# Patient Record
Sex: Female | Born: 1945 | Race: White | Hispanic: No | Marital: Married | Smoking: Never smoker
Health system: Southern US, Community
[De-identification: ages and names within clinical notes are randomized; demographics above are authoritative.]

## PROBLEM LIST (undated history)

## (undated) DIAGNOSIS — M1990 Unspecified osteoarthritis, unspecified site: Secondary | ICD-10-CM

## (undated) DIAGNOSIS — M810 Age-related osteoporosis without current pathological fracture: Secondary | ICD-10-CM

## (undated) DIAGNOSIS — I8390 Asymptomatic varicose veins of unspecified lower extremity: Secondary | ICD-10-CM

## (undated) DIAGNOSIS — H269 Unspecified cataract: Secondary | ICD-10-CM

## (undated) DIAGNOSIS — C801 Malignant (primary) neoplasm, unspecified: Secondary | ICD-10-CM

## (undated) HISTORY — DX: Asymptomatic varicose veins of unspecified lower extremity: I83.90

## (undated) HISTORY — DX: Age-related osteoporosis without current pathological fracture: M81.0

## (undated) HISTORY — DX: Malignant (primary) neoplasm, unspecified: C80.1

## (undated) HISTORY — PX: TUBAL LIGATION: SHX77

## (undated) HISTORY — DX: Unspecified osteoarthritis, unspecified site: M19.90

## (undated) HISTORY — PX: OTHER SURGICAL HISTORY: SHX169

## (undated) HISTORY — DX: Unspecified cataract: H26.9

---

## 2004-03-25 HISTORY — PX: COLONOSCOPY: SHX174

## 2005-02-28 ENCOUNTER — Other Ambulatory Visit: Admission: RE | Admit: 2005-02-28 | Discharge: 2005-02-28 | Payer: Self-pay | Admitting: Gynecology

## 2005-03-23 ENCOUNTER — Encounter: Admission: RE | Admit: 2005-03-23 | Discharge: 2005-03-23 | Payer: Self-pay | Admitting: Gynecology

## 2005-07-25 ENCOUNTER — Ambulatory Visit (HOSPITAL_BASED_OUTPATIENT_CLINIC_OR_DEPARTMENT_OTHER): Admission: RE | Admit: 2005-07-25 | Discharge: 2005-07-25 | Payer: Self-pay | Admitting: Surgery

## 2005-07-25 ENCOUNTER — Encounter (INDEPENDENT_AMBULATORY_CARE_PROVIDER_SITE_OTHER): Payer: Self-pay | Admitting: *Deleted

## 2006-04-05 ENCOUNTER — Encounter: Admission: RE | Admit: 2006-04-05 | Discharge: 2006-04-05 | Payer: Self-pay | Admitting: Gynecology

## 2006-04-12 ENCOUNTER — Other Ambulatory Visit: Admission: RE | Admit: 2006-04-12 | Discharge: 2006-04-12 | Payer: Self-pay | Admitting: Gynecology

## 2007-06-19 ENCOUNTER — Encounter: Admission: RE | Admit: 2007-06-19 | Discharge: 2007-06-19 | Payer: Self-pay | Admitting: Gynecology

## 2007-06-27 ENCOUNTER — Other Ambulatory Visit: Admission: RE | Admit: 2007-06-27 | Discharge: 2007-06-27 | Payer: Self-pay | Admitting: Gynecology

## 2008-07-23 ENCOUNTER — Encounter: Admission: RE | Admit: 2008-07-23 | Discharge: 2008-07-23 | Payer: Self-pay | Admitting: Gynecology

## 2010-06-18 ENCOUNTER — Encounter: Payer: Self-pay | Admitting: Gynecology

## 2013-01-06 DIAGNOSIS — Z1211 Encounter for screening for malignant neoplasm of colon: Secondary | ICD-10-CM | POA: Diagnosis not present

## 2013-01-22 DIAGNOSIS — M810 Age-related osteoporosis without current pathological fracture: Secondary | ICD-10-CM | POA: Diagnosis not present

## 2013-01-22 DIAGNOSIS — I8390 Asymptomatic varicose veins of unspecified lower extremity: Secondary | ICD-10-CM | POA: Diagnosis not present

## 2013-02-26 DIAGNOSIS — M810 Age-related osteoporosis without current pathological fracture: Secondary | ICD-10-CM | POA: Diagnosis not present

## 2013-02-26 DIAGNOSIS — I8390 Asymptomatic varicose veins of unspecified lower extremity: Secondary | ICD-10-CM | POA: Diagnosis not present

## 2013-02-26 DIAGNOSIS — Z79899 Other long term (current) drug therapy: Secondary | ICD-10-CM | POA: Diagnosis not present

## 2013-02-26 DIAGNOSIS — K219 Gastro-esophageal reflux disease without esophagitis: Secondary | ICD-10-CM | POA: Diagnosis not present

## 2013-03-14 DIAGNOSIS — Z23 Encounter for immunization: Secondary | ICD-10-CM | POA: Diagnosis not present

## 2013-03-26 DIAGNOSIS — R55 Syncope and collapse: Secondary | ICD-10-CM | POA: Diagnosis not present

## 2013-03-26 DIAGNOSIS — R002 Palpitations: Secondary | ICD-10-CM | POA: Diagnosis not present

## 2013-03-26 DIAGNOSIS — R42 Dizziness and giddiness: Secondary | ICD-10-CM | POA: Diagnosis not present

## 2013-03-26 DIAGNOSIS — M810 Age-related osteoporosis without current pathological fracture: Secondary | ICD-10-CM | POA: Diagnosis not present

## 2013-03-28 DIAGNOSIS — M810 Age-related osteoporosis without current pathological fracture: Secondary | ICD-10-CM | POA: Diagnosis not present

## 2013-05-01 DIAGNOSIS — Z23 Encounter for immunization: Secondary | ICD-10-CM | POA: Diagnosis not present

## 2013-05-01 DIAGNOSIS — M25579 Pain in unspecified ankle and joints of unspecified foot: Secondary | ICD-10-CM | POA: Diagnosis not present

## 2013-07-18 DIAGNOSIS — L821 Other seborrheic keratosis: Secondary | ICD-10-CM | POA: Diagnosis not present

## 2013-07-18 DIAGNOSIS — L820 Inflamed seborrheic keratosis: Secondary | ICD-10-CM | POA: Diagnosis not present

## 2013-07-18 DIAGNOSIS — D235 Other benign neoplasm of skin of trunk: Secondary | ICD-10-CM | POA: Diagnosis not present

## 2013-07-18 DIAGNOSIS — D1801 Hemangioma of skin and subcutaneous tissue: Secondary | ICD-10-CM | POA: Diagnosis not present

## 2013-07-18 DIAGNOSIS — D2370 Other benign neoplasm of skin of unspecified lower limb, including hip: Secondary | ICD-10-CM | POA: Diagnosis not present

## 2013-10-03 DIAGNOSIS — M810 Age-related osteoporosis without current pathological fracture: Secondary | ICD-10-CM | POA: Diagnosis not present

## 2013-11-19 DIAGNOSIS — Z1231 Encounter for screening mammogram for malignant neoplasm of breast: Secondary | ICD-10-CM | POA: Diagnosis not present

## 2013-11-19 DIAGNOSIS — Z01419 Encounter for gynecological examination (general) (routine) without abnormal findings: Secondary | ICD-10-CM | POA: Diagnosis not present

## 2014-03-05 DIAGNOSIS — J1181 Influenza due to unidentified influenza virus with encephalopathy: Secondary | ICD-10-CM | POA: Diagnosis not present

## 2014-03-31 DIAGNOSIS — I868 Varicose veins of other specified sites: Secondary | ICD-10-CM | POA: Diagnosis not present

## 2014-03-31 DIAGNOSIS — M25551 Pain in right hip: Secondary | ICD-10-CM | POA: Diagnosis not present

## 2014-03-31 DIAGNOSIS — J069 Acute upper respiratory infection, unspecified: Secondary | ICD-10-CM | POA: Diagnosis not present

## 2014-04-10 ENCOUNTER — Encounter: Payer: Self-pay | Admitting: Vascular Surgery

## 2014-04-10 ENCOUNTER — Other Ambulatory Visit: Payer: Self-pay | Admitting: *Deleted

## 2014-04-10 DIAGNOSIS — I83893 Varicose veins of bilateral lower extremities with other complications: Secondary | ICD-10-CM

## 2014-04-15 DIAGNOSIS — M25551 Pain in right hip: Secondary | ICD-10-CM | POA: Diagnosis not present

## 2014-04-15 DIAGNOSIS — M810 Age-related osteoporosis without current pathological fracture: Secondary | ICD-10-CM | POA: Diagnosis not present

## 2014-05-12 DIAGNOSIS — H40013 Open angle with borderline findings, low risk, bilateral: Secondary | ICD-10-CM | POA: Diagnosis not present

## 2014-05-12 DIAGNOSIS — H25812 Combined forms of age-related cataract, left eye: Secondary | ICD-10-CM | POA: Diagnosis not present

## 2014-05-13 ENCOUNTER — Encounter: Payer: Self-pay | Admitting: Vascular Surgery

## 2014-05-14 ENCOUNTER — Ambulatory Visit (INDEPENDENT_AMBULATORY_CARE_PROVIDER_SITE_OTHER): Payer: Medicare Other | Admitting: Vascular Surgery

## 2014-05-14 ENCOUNTER — Ambulatory Visit (HOSPITAL_COMMUNITY)
Admission: RE | Admit: 2014-05-14 | Discharge: 2014-05-14 | Disposition: A | Discharge status: Home / Self Care | Payer: Medicare Other | Source: Ambulatory Visit | Attending: Vascular Surgery | Admitting: Vascular Surgery

## 2014-05-14 ENCOUNTER — Encounter: Payer: Self-pay | Admitting: Vascular Surgery

## 2014-05-14 VITALS — BP 106/71 | HR 80 | Ht 67.5 in | Wt 127.3 lb

## 2014-05-14 DIAGNOSIS — I83893 Varicose veins of bilateral lower extremities with other complications: Secondary | ICD-10-CM | POA: Diagnosis not present

## 2014-05-14 DIAGNOSIS — I8393 Asymptomatic varicose veins of bilateral lower extremities: Secondary | ICD-10-CM | POA: Diagnosis not present

## 2014-05-14 NOTE — Progress Notes (Signed)
[facility] [place] HISTORY AND PHYSICAL   History of Present Illness:  Patient is a 68 y.o. year old female who presents for evaluation of symptomatic varicose veins. The patient has had varicose veins for several years. However the last few years her legs have begun to hurt more. She primarily complains of pain over a cluster of varicosities on the right posterior thigh. She does a lot of walking for exercise. She states that her legs become heavy tired and achy especially over this cluster of veins on the back of her leg after exercising for long periods of time. She has similar symptoms in the left leg but not as bad. She has a cluster of varicosities on the left ankle and the bother her more on the left leg. She has worn some light compression in the past and has had some benefit from this. However the cluster of varicosities on the right posterior thigh is high enough that she does not get much relief from this. She denies prior history of ulcers or bleeding from her varicose veins. She denies prior history of DVT. Other medical problems include osteoporosis which is stable.  Past Medical History  Diagnosis Date  . Osteoporosis   . Varicose veins   . Cancer     squamous cell     Past Surgical History  Procedure Laterality Date  . Colonoscopy  03/25/2004  . Tubal ligation      Social History History  Substance Use Topics  . Smoking status: Never Smoker   . Smokeless tobacco: Never Used  . Alcohol Use: 0.0 oz/week    0 Not specified per week     Comment: occ    Family History Family History  Problem Relation Age of Onset  . Stroke Father   . Cancer Father     cancer  . Varicose Veins Father   . Bleeding Disorder Father   . Cancer Brother     Allergies  No Known Allergies   Current Outpatient Prescriptions  Medication Sig Dispense Refill  . aspirin 81 MG tablet Take 81 mg by mouth daily.    Marland Kitchen denosumab (PROLIA) 60 MG/ML SOLN injection Inject 60  mg into the skin every 6 (six) months. Administer in upper arm, thigh, or abdomen    . Multiple Vitamin (MULTIVITAMIN) tablet Take 1 tablet by mouth daily.     No current facility-administered medications for this visit.    ROS:   General:  No weight loss, Fever, chills  HEENT: No recent headaches, no nasal bleeding, no visual changes, no sore throat  Neurologic: No dizziness, blackouts, seizures. No recent symptoms of stroke or mini- stroke. No recent episodes of slurred speech, or temporary blindness.  Cardiac: No recent episodes of chest pain/pressure, no shortness of breath at rest.  No shortness of breath with exertion.  Denies history of atrial fibrillation or irregular heartbeat  Vascular: No history of rest pain in feet.  No history of claudication.  No history of non-healing ulcer, No history of DVT   Pulmonary: No home oxygen, no productive cough, no hemoptysis,  No asthma or wheezing  Musculoskeletal:  [ ]  Arthritis, [ ]  Low back pain,  [ ]  Joint pain  Hematologic:No history of hypercoagulable state.  No history of easy bleeding.  No history of anemia  Gastrointestinal: No hematochezia or melena,  No gastroesophageal reflux, no trouble swallowing  Urinary: [ ]  chronic Kidney disease, [ ]  on HD - [ ]  MWF or [ ]   TTHS, [ ]  Burning with urination, [ ]  Frequent urination, [ ]  Difficulty urinating;   Skin: No rashes  Psychological: No history of anxiety,  No history of depression   Physical Examination  Filed Vitals:   05/14/14 1003  BP: 106/71  Pulse: 80  Height: 5' 7.5" (1.715 m)  Weight: 127 lb 4.8 oz (57.743 kg)  SpO2: 100%    Body mass index is 19.63 kg/(m^2).  General:  Alert and oriented, no acute distress HEENT: Normal Neck: No bruit or JVD Pulmonary: Clear to auscultation bilaterally Cardiac: Regular Rate and Rhythm without murmur Abdomen: Soft, non-tender, non-distended, no mass Skin: No rash, scattered spider-type varicosities dorsal/anterior thigh  bilaterally with few scattered in the calf as well, large clusters 4-6 mm in diameter over an area of 5-7 cm right upper posterior thigh, right lateral knee, left posterior calf, medial and lateral left knee, left lateral ankle, right posterior calf Extremity Pulses:  2+ radial, brachial, femoral, dorsalis pedis pulses bilaterally Musculoskeletal: No deformity or edema  Neurologic: Upper and lower extremity motor 5/5 and symmetric  DATA:  Patient had bilateral venous duplex exam today. This showed evidence of reflux in the deep system in the femoral and popliteal vein. She also had reflux in the greater saphenous vein bilaterally. Vein diameter was 4-6 mm.   ASSESSMENT: Bilateral symptomatic varicose veins. Right greater than left.   PLAN:  The patient was given a prescription today for bilateral thigh-high compression stockings. She will wear these continuously during the day to see if she gets symptomatic relief. If she has not improved significantly she will consider laser ablation and possible several areas of sclerotherapy or stab avulsion in 3 months.  Ruta Hinds, MD [facility] [place] Office: [phone] Pager: [phone]

## 2014-06-04 DIAGNOSIS — H52202 Unspecified astigmatism, left eye: Secondary | ICD-10-CM | POA: Diagnosis not present

## 2014-06-04 DIAGNOSIS — H2512 Age-related nuclear cataract, left eye: Secondary | ICD-10-CM | POA: Diagnosis not present

## 2014-06-04 DIAGNOSIS — H25812 Combined forms of age-related cataract, left eye: Secondary | ICD-10-CM | POA: Diagnosis not present

## 2014-06-04 HISTORY — PX: OTHER SURGICAL HISTORY: SHX169

## 2014-06-15 DIAGNOSIS — H25811 Combined forms of age-related cataract, right eye: Secondary | ICD-10-CM | POA: Diagnosis not present

## 2014-06-15 DIAGNOSIS — H52201 Unspecified astigmatism, right eye: Secondary | ICD-10-CM | POA: Diagnosis not present

## 2014-06-15 DIAGNOSIS — H2511 Age-related nuclear cataract, right eye: Secondary | ICD-10-CM | POA: Diagnosis not present

## 2014-06-15 HISTORY — PX: OTHER SURGICAL HISTORY: SHX169

## 2014-06-30 DIAGNOSIS — H35371 Puckering of macula, right eye: Secondary | ICD-10-CM | POA: Diagnosis not present

## 2014-07-23 DIAGNOSIS — D225 Melanocytic nevi of trunk: Secondary | ICD-10-CM | POA: Diagnosis not present

## 2014-07-23 DIAGNOSIS — D2239 Melanocytic nevi of other parts of face: Secondary | ICD-10-CM | POA: Diagnosis not present

## 2014-07-23 DIAGNOSIS — D1801 Hemangioma of skin and subcutaneous tissue: Secondary | ICD-10-CM | POA: Diagnosis not present

## 2014-07-23 DIAGNOSIS — L821 Other seborrheic keratosis: Secondary | ICD-10-CM | POA: Diagnosis not present

## 2014-08-17 ENCOUNTER — Encounter: Payer: Self-pay | Admitting: Vascular Surgery

## 2014-08-18 ENCOUNTER — Ambulatory Visit (INDEPENDENT_AMBULATORY_CARE_PROVIDER_SITE_OTHER): Payer: Medicare Other | Admitting: Vascular Surgery

## 2014-08-18 ENCOUNTER — Encounter: Payer: Self-pay | Admitting: Vascular Surgery

## 2014-08-18 VITALS — BP 107/73 | HR 77 | Resp 18 | Ht 67.5 in | Wt 128.0 lb

## 2014-08-18 DIAGNOSIS — I83893 Varicose veins of bilateral lower extremities with other complications: Secondary | ICD-10-CM

## 2014-08-18 DIAGNOSIS — I83899 Varicose veins of unspecified lower extremities with other complications: Secondary | ICD-10-CM | POA: Insufficient documentation

## 2014-08-18 NOTE — Progress Notes (Signed)
Problems with Activities of Daily Living Secondary to Leg Pain  1. Mrs. Angela Juarez states any activities that require prolonged standing (cooking, cleaning, shopping) is difficult due to leg pain.    2. Mrs. Angela Juarez states that walking (for exercise) is difficult due to leg pain.    3. Mrs. Angela Juarez states that prolonged sitting while traveling in car is difficult due to leg pain.     Failure of  Conservative Therapy:  1. Worn 20-30 mm Hg thigh high compression hose >3 months with no relief of symptoms.  2. Frequently elevates legs-no relief of symptoms  3. Taken Ibuprofen 600 Mg TID with no relief of symptoms.  Patient continues to have bilateral lower extremity discomfort with prolonged standing despite very compliant use of her thigh-high graduated compression garments. The pain is over the anterior and lateral right thigh and medial left thigh and calf.  She does have 2+ dorsalis pedis pulses bilaterally. She does have plexus of varicosities over her right anterior thigh extending to her lateral knee and down her lateral calf. I imaged these areas with SonoSite ultrasound this does track up to the saphenous vein below the saphenofemoral junction. On the left she has varicosities throughout her medial calf and these do arise from her left great saphenous vein  Impression and plan failed conservative treatment of venous hypertension. Have recommended staged bilateral laser ablation of great saphenous vein. Explained that she may require subsequent stab phlebectomy of tributary varicosities should she not have complete relief with the ablation. I explained that Medicare requires that these are done in a staged fashion. She wished to proceed as soon as possible.

## 2014-08-20 ENCOUNTER — Other Ambulatory Visit: Payer: Self-pay | Admitting: *Deleted

## 2014-08-20 DIAGNOSIS — I83893 Varicose veins of bilateral lower extremities with other complications: Secondary | ICD-10-CM

## 2014-08-24 ENCOUNTER — Telehealth: Payer: Self-pay | Admitting: Vascular Surgery

## 2014-08-24 NOTE — Telephone Encounter (Signed)
-----   Message from Rica Records, RN sent at 08/20/2014  5:20 PM EDT ----- Regarding: scheduling Please schedule Frances Maywood for:  1. 09-17-2014   Post LA duplex (right leg, order in EPIC) and VV FU with Dr. Donnetta Hutching.  2.  10-06-2014 Post LA duplex (left leg, order in EPIC) and VV FU with Dr. Donnetta Hutching.    Thanks!

## 2014-09-07 DIAGNOSIS — E785 Hyperlipidemia, unspecified: Secondary | ICD-10-CM | POA: Diagnosis not present

## 2014-09-07 DIAGNOSIS — R002 Palpitations: Secondary | ICD-10-CM | POA: Diagnosis not present

## 2014-09-09 ENCOUNTER — Encounter: Payer: Self-pay | Admitting: Vascular Surgery

## 2014-09-10 ENCOUNTER — Encounter: Payer: Self-pay | Admitting: Vascular Surgery

## 2014-09-10 ENCOUNTER — Ambulatory Visit (INDEPENDENT_AMBULATORY_CARE_PROVIDER_SITE_OTHER): Payer: Medicare Other | Admitting: Vascular Surgery

## 2014-09-10 VITALS — BP 109/64 | HR 70 | Resp 16 | Ht 67.5 in | Wt 128.0 lb

## 2014-09-10 DIAGNOSIS — I83893 Varicose veins of bilateral lower extremities with other complications: Secondary | ICD-10-CM | POA: Diagnosis not present

## 2014-09-10 HISTORY — PX: ENDOVENOUS ABLATION SAPHENOUS VEIN W/ LASER: SUR449

## 2014-09-10 NOTE — Progress Notes (Signed)
     Laser Ablation Procedure    Date: 09/10/2014   Angela Juarez DOB:Oct 21, 1945  Consent signed: Yes    Surgeon:  Dr. Sherren Mocha Ashdon Gillson  Procedure: Laser Ablation: right Greater Saphenous Vein  BP 109/64 mmHg  Pulse 70  Resp 16  Ht 5' 7.5" (1.715 m)  Wt 128 lb (58.06 kg)  BMI 19.74 kg/m2  Tumescent Anesthesia: 400 cc 0.9% NaCl with 50 cc Lidocaine HCL with 1% Epi and 15 cc 8.4% NaHCO3  Local Anesthesia: 62172 cc Lidocaine HCL and NaHCO3 (ratio 2:1)  15 watts continuous mode        Total energy: 2172   Total time: 2:25    Patient tolerated procedure well  Notes:   Description of Procedure:  After marking the course of the secondary varicosities, the patient was placed on the operating table in the supine position, and the right leg was prepped and draped in sterile fashion.   Local anesthetic was administered and under ultrasound guidance the saphenous vein was accessed with a micro needle and guide wire; then the mirco puncture sheath was placed.  A guide wire was inserted saphenofemoral junction , followed by a 5 french sheath.  The position of the sheath and then the laser fiber below the junction was confirmed using the ultrasound.  Tumescent anesthesia was administered along the course of the saphenous vein using ultrasound guidance. The patient was placed in Trendelenburg position and protective laser glasses were placed on patient and staff, and the laser was fired at 15 watts continuous mode advancing 1-77mm/second for a total of 2172 joules.    Steri strips were applied to the stab wounds and ABD pads and thigh high compression stockings were applied.  Ace wrap bandages were applied over the phlebectomy sites and at the top of the saphenofemoral junction. Blood loss was less than 15 cc.  The patient ambulated out of the operating room having tolerated the procedure well.

## 2014-09-16 ENCOUNTER — Encounter: Payer: Self-pay | Admitting: Vascular Surgery

## 2014-09-17 ENCOUNTER — Encounter: Payer: Self-pay | Admitting: Vascular Surgery

## 2014-09-17 ENCOUNTER — Ambulatory Visit (HOSPITAL_COMMUNITY)
Admission: RE | Admit: 2014-09-17 | Discharge: 2014-09-17 | Disposition: A | Discharge status: Home / Self Care | Payer: Medicare Other | Source: Ambulatory Visit | Attending: Vascular Surgery | Admitting: Vascular Surgery

## 2014-09-17 ENCOUNTER — Ambulatory Visit (INDEPENDENT_AMBULATORY_CARE_PROVIDER_SITE_OTHER): Payer: Medicare Other | Admitting: Vascular Surgery

## 2014-09-17 VITALS — BP 106/67 | HR 67 | Resp 16 | Ht 67.5 in | Wt 127.0 lb

## 2014-09-17 DIAGNOSIS — I83893 Varicose veins of bilateral lower extremities with other complications: Secondary | ICD-10-CM

## 2014-09-17 NOTE — Progress Notes (Signed)
Patient presents today for one-week follow-up of ablation of her right great saphenous vein. She had minimal bruising and minimal discomfort associated with this. She has been compliant with her compression garments. On physical exam she does have very minimal bruising in her medial thigh. Continues to have distention of tributary varicosities over her lateral calf and anterior thigh.  On duplex today she does have closure of her great saphenous vein from the entry site at her knee I just to the 1.4 cm below the saphenofemoral junction with no evidence of DVT  Impression and plan excellent Angela Juarez result from ablation of great saphenous vein on the right. She is scheduled for similar treatment to her left great saphenous vein in one week. 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

## 2014-09-23 ENCOUNTER — Encounter: Payer: Self-pay | Admitting: Vascular Surgery

## 2014-09-24 ENCOUNTER — Ambulatory Visit (INDEPENDENT_AMBULATORY_CARE_PROVIDER_SITE_OTHER): Payer: Medicare Other | Admitting: Vascular Surgery

## 2014-09-24 ENCOUNTER — Encounter: Payer: Self-pay | Admitting: Vascular Surgery

## 2014-09-24 VITALS — BP 110/72 | HR 69 | Resp 18 | Ht 67.5 in | Wt 128.0 lb

## 2014-09-24 DIAGNOSIS — I83893 Varicose veins of bilateral lower extremities with other complications: Secondary | ICD-10-CM

## 2014-09-24 HISTORY — PX: ENDOVENOUS ABLATION SAPHENOUS VEIN W/ LASER: SUR449

## 2014-09-24 NOTE — Progress Notes (Signed)
     Laser Ablation Procedure    Date: 09/24/2014   Angela Juarez DOB:1945-07-22  Consent signed: Yes    Surgeon:  Dr. Sherren Mocha Early  Procedure: Laser Ablation: left Greater Saphenous Vein  BP 110/72 mmHg  Pulse 69  Resp 18  Ht 5' 7.5" (1.715 m)  Wt 128 lb (58.06 kg)  BMI 19.74 kg/m2  Tumescent Anesthesia: 400 cc 0.9% NaCl with 50 cc Lidocaine HCL with 1% Epi and 15 cc 8.4% NaHCO3  Local Anesthesia: 3 cc Lidocaine HCL and NaHCO3 (ratio 2:1)  15 watts continuous mode        Total energy: 2006 Joules   Total time: 2:13      Patient tolerated procedure well    Description of Procedure:  After marking the course of the secondary varicosities, the patient was placed on the operating table in the supine position, and the left leg was prepped and draped in sterile fashion.   Local anesthetic was administered and under ultrasound guidance the saphenous vein was accessed with a micro needle and guide wire; then the mirco puncture sheath was placed.  A guide wire was inserted saphenofemoral junction , followed by a 5 french sheath.  The position of the sheath and then the laser fiber below the junction was confirmed using the ultrasound.  Tumescent anesthesia was administered along the course of the saphenous vein using ultrasound guidance. The patient was placed in Trendelenburg position and protective laser glasses were placed on patient and staff, and the laser was fired at 15 watts continuous mode advancing 1-64mm/second for a total of 2006 joules.   AndSteri strips were applied and ABD pads and thigh high compression stockings were applied.  Ace wrap bandages were applied over the phlebectomy sites and at the top of the saphenofemoral junction. Blood loss was less than 15 cc.  The patient ambulated out of the operating room having tolerated the procedure well.

## 2014-09-25 ENCOUNTER — Telehealth: Payer: Self-pay | Admitting: *Deleted

## 2014-09-25 NOTE — Telephone Encounter (Signed)
    09/25/2014  Time: 11:40 AM   Patient Name: Angela Juarez  Patient of: T.F. Early  Procedure:Laser Ablation left greater saphenous vein 09-24-2014   Reached patient at home and checked  Her status  Yes    Comments/Actions Taken: Mrs. Obryan states she is not experiencing left leg pain or swelling.  Reviewed all post laser ablation nstructions with her and reminded her of post LA duplex and VV FU appointment with Dr. Donnetta Hutching on 10-06-2014.      @SIGNATURE @

## 2014-10-02 ENCOUNTER — Encounter: Payer: Self-pay | Admitting: Vascular Surgery

## 2014-10-02 DIAGNOSIS — M810 Age-related osteoporosis without current pathological fracture: Secondary | ICD-10-CM | POA: Diagnosis not present

## 2014-10-06 ENCOUNTER — Encounter: Payer: Self-pay | Admitting: Vascular Surgery

## 2014-10-06 ENCOUNTER — Ambulatory Visit (HOSPITAL_COMMUNITY)
Admission: RE | Admit: 2014-10-06 | Discharge: 2014-10-06 | Disposition: A | Discharge status: Home / Self Care | Payer: Medicare Other | Source: Ambulatory Visit | Attending: Vascular Surgery | Admitting: Vascular Surgery

## 2014-10-06 ENCOUNTER — Ambulatory Visit (INDEPENDENT_AMBULATORY_CARE_PROVIDER_SITE_OTHER): Payer: Medicare Other | Admitting: Vascular Surgery

## 2014-10-06 VITALS — BP 113/64 | HR 76 | Resp 14 | Ht 67.5 in | Wt 127.0 lb

## 2014-10-06 DIAGNOSIS — I83893 Varicose veins of bilateral lower extremities with other complications: Secondary | ICD-10-CM | POA: Insufficient documentation

## 2014-10-06 NOTE — Progress Notes (Signed)
Patient presents today for follow-up of left leg laser ablation 10 days ago. She reports typical amount of inflammation around the time of the procedure. This has resolved. She has minimal bruising  Past Medical History  Diagnosis Date  . Osteoporosis   . Varicose veins   . Cancer     squamous cell     History  Substance Use Topics  . Smoking status: Never Smoker   . Smokeless tobacco: Never Used  . Alcohol Use: 0.0 oz/week    0 Standard drinks or equivalent per week     Comment: occ    Family History  Problem Relation Age of Onset  . Stroke Father   . Cancer Father     cancer  . Varicose Veins Father   . Bleeding Disorder Father   . Cancer Brother     No Known Allergies   Current outpatient prescriptions:  .  aspirin 81 MG tablet, Take 81 mg by mouth daily., Disp: , Rfl:  .  denosumab (PROLIA) 60 MG/ML SOLN injection, Inject 60 mg into the skin every 6 (six) months. Administer in upper arm, thigh, or abdomen, Disp: , Rfl:  .  Multiple Vitamin (MULTIVITAMIN) tablet, Take 1 tablet by mouth daily., Disp: , Rfl:   Filed Vitals:   10/06/14 0901  BP: 113/64  Pulse: 76  Resp: 14  Height: 5' 7.5" (1.715 m)  Weight: 127 lb (57.607 kg)    Body mass index is 19.59 kg/(m^2).       She has had minimal bruising over the medial left thigh. Does have some decompression in the varicosities on her pretibial area and left lateral ankle. Continues to have painful varicosities over the right lateral thigh.  She did undergo repeat duplex today of her left leg. This showed successful ablation of her saphenous vein from the distal insertion site at her knee to 0.6 cm from the saphenofemoral junction. No evidence of DVT  Impression and plan successful bilateral staged ablation of great saphenous vein. We'll continue her compression garments on an as-needed basis. Will see [place] again in 3 months to determine if stab phlebectomy is required in her right and/or left leg.

## 2014-11-23 ENCOUNTER — Other Ambulatory Visit: Payer: Self-pay

## 2014-11-24 DIAGNOSIS — Z1231 Encounter for screening mammogram for malignant neoplasm of breast: Secondary | ICD-10-CM | POA: Diagnosis not present

## 2014-12-04 ENCOUNTER — Encounter: Payer: Self-pay | Admitting: Vascular Surgery

## 2014-12-08 ENCOUNTER — Ambulatory Visit (INDEPENDENT_AMBULATORY_CARE_PROVIDER_SITE_OTHER): Payer: Medicare Other | Admitting: Vascular Surgery

## 2014-12-08 ENCOUNTER — Encounter: Payer: Self-pay | Admitting: Vascular Surgery

## 2014-12-08 VITALS — BP 123/74 | HR 65 | Temp 98.4°F | Resp 18 | Ht 67.5 in | Wt 126.8 lb

## 2014-12-08 DIAGNOSIS — I83893 Varicose veins of bilateral lower extremities with other complications: Secondary | ICD-10-CM

## 2014-12-08 NOTE — Progress Notes (Signed)
Patient resents today for continued discussion of venous hypertension in both lower extremities. She is status post staged bilateral great saphenous vein laser ablation 3 months ago. She continues to have large varicosities bilaterally. On the right leg this is throughout her anterior thigh going to her lateral thigh lateral knee and extending into her calf. On the left leg this is on her distal thigh lateral calf and extensively down onto her ankle. She continues to have discomfort despite compression garment usage of these.  Past Medical History  Diagnosis Date  . Osteoporosis   . Varicose veins   . Cancer     squamous cell     History  Substance Use Topics  . Smoking status: Never Smoker   . Smokeless tobacco: Never Used  . Alcohol Use: 0.0 oz/week    0 Standard drinks or equivalent per week     Comment: occ    Family History  Problem Relation Age of Onset  . Stroke Father   . Cancer Father     cancer  . Varicose Veins Father   . Bleeding Disorder Father   . Cancer Brother     No Known Allergies   Current outpatient prescriptions:  .  denosumab (PROLIA) 60 MG/ML SOLN injection, Inject 60 mg into the skin every 6 (six) months. Administer in upper arm, thigh, or abdomen, Disp: , Rfl:  .  Multiple Vitamin (MULTIVITAMIN) tablet, Take 1 tablet by mouth daily., Disp: , Rfl:  .  aspirin 81 MG tablet, Take 81 mg by mouth daily., Disp: , Rfl:   Filed Vitals:   12/08/14 1003  BP: 123/74  Pulse: 65  Temp: 98.4 F (36.9 C)  TempSrc: Oral  Resp: 18  Height: 5' 7.5" (1.715 m)  Weight: 126 lb 12.8 oz (57.516 kg)  SpO2: 98%    Body mass index is 19.56 kg/(m^2).       I did reimage her veins with SonoSite ultrasound this shows closure of her saphenous vein bilaterally  Impression and plan continued pain with tributary varicosities despite ablation of her great saphenous vein. Have recommended staged bilateral stab phlebectomy of multiple tributary tributary varicosities.  She understands this is under local anesthesia. Wishes to proceed the as soon as possible

## 2014-12-10 ENCOUNTER — Ambulatory Visit (INDEPENDENT_AMBULATORY_CARE_PROVIDER_SITE_OTHER): Payer: Medicare Other | Admitting: Vascular Surgery

## 2014-12-10 ENCOUNTER — Encounter: Payer: Self-pay | Admitting: Vascular Surgery

## 2014-12-10 VITALS — BP 104/69 | HR 84 | Resp 18 | Ht 67.5 in | Wt 126.8 lb

## 2014-12-10 DIAGNOSIS — I83893 Varicose veins of bilateral lower extremities with other complications: Secondary | ICD-10-CM | POA: Diagnosis not present

## 2014-12-10 HISTORY — PX: OTHER SURGICAL HISTORY: SHX169

## 2014-12-10 NOTE — Progress Notes (Signed)
    Stab Phlebectomy Procedure  Angela Juarez DOB:Apr 03, 1946  12/10/2014  Consent signed: Yes  Surgeon:T.F. Shakeera Rightmyer  Procedure: stab phlebectomy: right leg  > 20 incisions  BP 104/69 mmHg  Pulse 84  Resp 18  Ht 5' 7.5" (1.715 m)  Wt 126 lb 12.8 oz (57.516 kg)  BMI 19.56 kg/m2  Start time: 8:40AM   End time: 10:00AM   Tumescent Anesthesia: 450 cc 0.9% NaCl with 50 cc Lidocaine HCL with 1% Epi and 15 cc 8.4% NaHCO3  Local Anesthesia: 2 cc Lidocaine HCL and NaHCO3 (ratio 2:1)  Sclerotherapy: 0.3 %Sotradecol. Patient received a total of 3 cc  Stab Phlebectomy: >20 incisions Sites: Thigh and Calf right leg  Patient tolerated procedure well: Yes    Description of Procedure:  After marking the course of the secondary varicosities, the patient was placed on the operating table in the supine position, and the right leg was prepped and draped in sterile fashion.    The patient was then put into Trendelenburg position.  Local anesthetic was administered at the previously marked varicosities, and tumescent anesthesia was administered around the vessels.  Greater than 20 stab wounds were made using the tip of an 11 blade. And using the vein hook, the phlebectomies were performed using a hemostat to avulse the varicosities.  Adequate hemostasis was achieved, and steri strips were applied to the stab wound.    Sclerotherapy was performed to 3 varicosities using 3  cc .3% Sotradecol foam via a 30 gauge needle needle.  ABD pads and thigh high compression stockings were applied as well ace wraps where needed. Blood loss was less than 15 cc.  The patient ambulated out of the operating room having tolerated the procedure well.

## 2014-12-11 ENCOUNTER — Telehealth: Payer: Self-pay | Admitting: *Deleted

## 2014-12-11 NOTE — Telephone Encounter (Signed)
    12/11/2014  Time: 9:44 AM   Patient Name: Angela Juarez  Patient of: T.F. Early  Procedure:STAB PHLEBECTOMY AND SCLEROTHERAPY  right  LEG  12-10-2014   Reached patient at home and checked  Her status  Yes    Comments/Actions Taken: [name] states that she not experiencing any right leg bleeding or oozing.  No right leg swelling.  States minimal discomfort in right leg relieved by Advil.  Reviewed post procedural instructions with her and reminded her of VV follow up with Dr. Donnetta Hutching on 12-31-2014.      @SIGNATURE @

## 2014-12-15 ENCOUNTER — Encounter: Payer: Self-pay | Admitting: Vascular Surgery

## 2014-12-29 ENCOUNTER — Encounter: Payer: Self-pay | Admitting: Vascular Surgery

## 2014-12-31 ENCOUNTER — Encounter: Payer: Self-pay | Admitting: Vascular Surgery

## 2014-12-31 ENCOUNTER — Ambulatory Visit (INDEPENDENT_AMBULATORY_CARE_PROVIDER_SITE_OTHER): Payer: Medicare Other | Admitting: Vascular Surgery

## 2014-12-31 VITALS — BP 113/74 | HR 67 | Temp 96.9°F | Resp 18 | Ht 67.5 in | Wt 127.0 lb

## 2014-12-31 DIAGNOSIS — I83893 Varicose veins of bilateral lower extremities with other complications: Secondary | ICD-10-CM

## 2014-12-31 NOTE — Progress Notes (Signed)
    Stab Phlebectomy Procedure  Angela Juarez DOB:August 15, 1945  12/31/2014  Consent signed: Yes  Surgeon:T.F. Bayan Hedstrom  Procedure: stab phlebectomy: left leg  BP 113/74 mmHg  Pulse 67  Temp(Src) 96.9 F (36.1 C) (Oral)  Resp 18  Ht 5' 7.5" (1.715 m)  Wt 127 lb (57.607 kg)  BMI 19.59 kg/m2  SpO2 100%  Start time: 8:30AM   End time: 9:45AM   Tumescent Anesthesia: 475 cc 0.9% NaCl with 50 cc Lidocaine HCL with 1% Epi and 15 cc 8.4% NaHCO3  Local Anesthesia: 4 cc Lidocaine HCL and NaHCO3 (ratio 2:1)    Stab Phlebectomy: >20 incisions Sites: Thigh, Calf and Ankle  Left leg  Patient tolerated procedure well: Yes    Description of Procedure:  After marking the course of the secondary varicosities, the patient was placed on the operating table in the supine position, and the left leg was prepped and draped in sterile fashion.    The patient was then put into Trendelenburg position.  Local anesthetic was administered at the previously marked varicosities, and tumescent anesthesia was administered around the vessels.  Greater than 20 stab wounds were made using the tip of an 11 blade. And using the vein hook, the phlebectomies were performed using a hemostat to avulse the varicosities.  Adequate hemostasis was achieved, and steri strips were applied to the stab wound.      ABD pads and thigh high compression stockings were applied as well ace wraps where needed. Blood loss was less than 15 cc.  The patient ambulated out of the operating room having tolerated the procedure well.

## 2015-01-01 ENCOUNTER — Telehealth: Payer: Self-pay | Admitting: *Deleted

## 2015-01-01 ENCOUNTER — Encounter: Payer: Self-pay | Admitting: Vascular Surgery

## 2015-01-01 NOTE — Telephone Encounter (Signed)
    01/01/2015  Time: 9:36 AM   Patient Name: Angela Juarez  Patient of: T.F. Early  Procedure:stab phlebectomy >20 incisions left leg  12-31-2014  Reached patient at home and checked  Her status  Yes    Comments/Actions Taken: [name] states that she has discomfort around left ankle, but this discomfort is relieved by taking Advil.  No complaints of bleeding or oozing.  Advised her she could remove the Ace wrap and remove the ABD pads lying on top of the compression hose and then re-wrap her left leg with the Ace bandage.  [name] verbalized understanding.  Reviewed post procedural instructions with [name] and reminded her of follow up appointment with Dr. Donnetta Hutching on 01-21-2015.      @SIGNATURE @

## 2015-01-05 DIAGNOSIS — Z23 Encounter for immunization: Secondary | ICD-10-CM | POA: Diagnosis not present

## 2015-01-20 ENCOUNTER — Encounter: Payer: Self-pay | Admitting: Vascular Surgery

## 2015-01-21 ENCOUNTER — Encounter: Payer: Self-pay | Admitting: Vascular Surgery

## 2015-01-21 ENCOUNTER — Ambulatory Visit (INDEPENDENT_AMBULATORY_CARE_PROVIDER_SITE_OTHER): Payer: Self-pay | Admitting: Vascular Surgery

## 2015-01-21 VITALS — BP 98/65 | HR 52 | Resp 14 | Ht 67.5 in | Wt 126.0 lb

## 2015-01-21 DIAGNOSIS — I83893 Varicose veins of bilateral lower extremities with other complications: Secondary | ICD-10-CM

## 2015-01-21 NOTE — Progress Notes (Signed)
Here today for a follow-up of staged bilateral saphenous vein ablation and staged bilateral stab phlebectomy for symptomatic varicosities. She has done quite well is been compliant with her compression garments. She does report that she had one episode of swelling focal area on her right lateral knee the phlebectomy sites and then resolved and she also reports that after she removed her stockings from her left leg after 2 weeks of daytime use that she had one episode where she was quite active taking care of a 63-year-old grandchild. She reported the swelling specifically at her anterior ankle on the left. No bruising associated with this.  Physical exam her stab sites all look quite good and she has had a neck result from removal of her varicosities. No evidence of subcutaneous hematoma is present at the site.  Overall think she is doing quite well. She has had a very nice result. I did explain is unusual that she would have this type of swelling 2 weeks out but suspect this was related to some bleeding under the skin. This is completely resolved. She will continue her usual activity and were compression on as-needed basis

## 2015-02-02 DIAGNOSIS — Z23 Encounter for immunization: Secondary | ICD-10-CM | POA: Diagnosis not present

## 2015-02-26 DIAGNOSIS — J209 Acute bronchitis, unspecified: Secondary | ICD-10-CM | POA: Diagnosis not present

## 2015-04-21 DIAGNOSIS — Z9842 Cataract extraction status, left eye: Secondary | ICD-10-CM | POA: Diagnosis not present

## 2015-04-21 DIAGNOSIS — M810 Age-related osteoporosis without current pathological fracture: Secondary | ICD-10-CM | POA: Diagnosis not present

## 2015-04-21 DIAGNOSIS — K219 Gastro-esophageal reflux disease without esophagitis: Secondary | ICD-10-CM | POA: Diagnosis not present

## 2015-04-21 DIAGNOSIS — M7150 Other bursitis, not elsewhere classified, unspecified site: Secondary | ICD-10-CM | POA: Diagnosis not present

## 2015-04-21 DIAGNOSIS — Z7982 Long term (current) use of aspirin: Secondary | ICD-10-CM | POA: Diagnosis not present

## 2015-04-21 DIAGNOSIS — Z9841 Cataract extraction status, right eye: Secondary | ICD-10-CM | POA: Diagnosis not present

## 2015-06-18 DIAGNOSIS — H26491 Other secondary cataract, right eye: Secondary | ICD-10-CM | POA: Diagnosis not present

## 2015-07-26 DIAGNOSIS — L820 Inflamed seborrheic keratosis: Secondary | ICD-10-CM | POA: Diagnosis not present

## 2015-07-26 DIAGNOSIS — D485 Neoplasm of uncertain behavior of skin: Secondary | ICD-10-CM | POA: Diagnosis not present

## 2015-11-25 DIAGNOSIS — Z124 Encounter for screening for malignant neoplasm of cervix: Secondary | ICD-10-CM | POA: Diagnosis not present

## 2015-11-25 DIAGNOSIS — Z1231 Encounter for screening mammogram for malignant neoplasm of breast: Secondary | ICD-10-CM | POA: Diagnosis not present

## 2015-11-25 DIAGNOSIS — Z681 Body mass index (BMI) 19 or less, adult: Secondary | ICD-10-CM | POA: Diagnosis not present

## 2015-12-03 ENCOUNTER — Other Ambulatory Visit: Payer: Self-pay | Admitting: Obstetrics and Gynecology

## 2015-12-03 DIAGNOSIS — R928 Other abnormal and inconclusive findings on diagnostic imaging of breast: Secondary | ICD-10-CM

## 2015-12-10 ENCOUNTER — Ambulatory Visit
Admission: RE | Admit: 2015-12-10 | Discharge: 2015-12-10 | Disposition: A | Discharge status: Home / Self Care | Payer: Medicare Other | Source: Ambulatory Visit | Attending: Obstetrics and Gynecology | Admitting: Obstetrics and Gynecology

## 2015-12-10 DIAGNOSIS — R928 Other abnormal and inconclusive findings on diagnostic imaging of breast: Secondary | ICD-10-CM

## 2015-12-10 DIAGNOSIS — R922 Inconclusive mammogram: Secondary | ICD-10-CM | POA: Diagnosis not present

## 2016-01-25 ENCOUNTER — Other Ambulatory Visit: Payer: Self-pay

## 2016-03-01 DIAGNOSIS — Z23 Encounter for immunization: Secondary | ICD-10-CM | POA: Diagnosis not present

## 2016-04-19 DIAGNOSIS — Z7982 Long term (current) use of aspirin: Secondary | ICD-10-CM | POA: Diagnosis not present

## 2016-04-19 DIAGNOSIS — M8000XA Age-related osteoporosis with current pathological fracture, unspecified site, initial encounter for fracture: Secondary | ICD-10-CM | POA: Diagnosis not present

## 2016-04-19 DIAGNOSIS — M818 Other osteoporosis without current pathological fracture: Secondary | ICD-10-CM | POA: Diagnosis not present

## 2016-04-19 DIAGNOSIS — Z7983 Long term (current) use of bisphosphonates: Secondary | ICD-10-CM | POA: Diagnosis not present

## 2016-06-06 DIAGNOSIS — M818 Other osteoporosis without current pathological fracture: Secondary | ICD-10-CM | POA: Diagnosis not present

## 2016-06-06 DIAGNOSIS — Z7983 Long term (current) use of bisphosphonates: Secondary | ICD-10-CM | POA: Diagnosis not present

## 2016-06-06 DIAGNOSIS — Z79899 Other long term (current) drug therapy: Secondary | ICD-10-CM | POA: Diagnosis not present

## 2016-07-27 DIAGNOSIS — L820 Inflamed seborrheic keratosis: Secondary | ICD-10-CM | POA: Diagnosis not present

## 2016-07-27 DIAGNOSIS — D485 Neoplasm of uncertain behavior of skin: Secondary | ICD-10-CM | POA: Diagnosis not present

## 2016-11-22 DIAGNOSIS — L570 Actinic keratosis: Secondary | ICD-10-CM | POA: Diagnosis not present

## 2016-11-27 DIAGNOSIS — Z681 Body mass index (BMI) 19 or less, adult: Secondary | ICD-10-CM | POA: Diagnosis not present

## 2016-11-27 DIAGNOSIS — Z1231 Encounter for screening mammogram for malignant neoplasm of breast: Secondary | ICD-10-CM | POA: Diagnosis not present

## 2016-11-27 DIAGNOSIS — Z01419 Encounter for gynecological examination (general) (routine) without abnormal findings: Secondary | ICD-10-CM | POA: Diagnosis not present

## 2017-02-23 DIAGNOSIS — Z23 Encounter for immunization: Secondary | ICD-10-CM | POA: Diagnosis not present

## 2017-07-04 DIAGNOSIS — Z681 Body mass index (BMI) 19 or less, adult: Secondary | ICD-10-CM | POA: Diagnosis not present

## 2017-07-04 DIAGNOSIS — M810 Age-related osteoporosis without current pathological fracture: Secondary | ICD-10-CM | POA: Diagnosis not present

## 2017-07-04 DIAGNOSIS — M818 Other osteoporosis without current pathological fracture: Secondary | ICD-10-CM | POA: Diagnosis not present

## 2017-07-04 DIAGNOSIS — Z7982 Long term (current) use of aspirin: Secondary | ICD-10-CM | POA: Diagnosis not present

## 2017-07-04 DIAGNOSIS — I8390 Asymptomatic varicose veins of unspecified lower extremity: Secondary | ICD-10-CM | POA: Diagnosis not present

## 2017-07-20 DIAGNOSIS — M818 Other osteoporosis without current pathological fracture: Secondary | ICD-10-CM | POA: Diagnosis not present

## 2017-07-25 DIAGNOSIS — R042 Hemoptysis: Secondary | ICD-10-CM | POA: Diagnosis not present

## 2017-07-25 DIAGNOSIS — Z79899 Other long term (current) drug therapy: Secondary | ICD-10-CM | POA: Diagnosis not present

## 2017-07-25 DIAGNOSIS — J209 Acute bronchitis, unspecified: Secondary | ICD-10-CM | POA: Diagnosis not present

## 2017-07-25 DIAGNOSIS — Z9181 History of falling: Secondary | ICD-10-CM | POA: Diagnosis not present

## 2017-07-25 DIAGNOSIS — Z681 Body mass index (BMI) 19 or less, adult: Secondary | ICD-10-CM | POA: Diagnosis not present

## 2017-07-25 DIAGNOSIS — E559 Vitamin D deficiency, unspecified: Secondary | ICD-10-CM | POA: Diagnosis not present

## 2017-07-25 DIAGNOSIS — Z1339 Encounter for screening examination for other mental health and behavioral disorders: Secondary | ICD-10-CM | POA: Diagnosis not present

## 2017-07-30 DIAGNOSIS — R042 Hemoptysis: Secondary | ICD-10-CM | POA: Diagnosis not present

## 2017-07-30 DIAGNOSIS — R918 Other nonspecific abnormal finding of lung field: Secondary | ICD-10-CM | POA: Diagnosis not present

## 2017-08-02 DIAGNOSIS — L821 Other seborrheic keratosis: Secondary | ICD-10-CM | POA: Diagnosis not present

## 2017-08-02 DIAGNOSIS — L918 Other hypertrophic disorders of the skin: Secondary | ICD-10-CM | POA: Diagnosis not present

## 2017-08-06 DIAGNOSIS — Z8709 Personal history of other diseases of the respiratory system: Secondary | ICD-10-CM | POA: Diagnosis not present

## 2017-08-06 DIAGNOSIS — K219 Gastro-esophageal reflux disease without esophagitis: Secondary | ICD-10-CM | POA: Diagnosis not present

## 2017-08-06 DIAGNOSIS — Z681 Body mass index (BMI) 19 or less, adult: Secondary | ICD-10-CM | POA: Diagnosis not present

## 2017-09-03 ENCOUNTER — Encounter: Payer: Self-pay | Admitting: *Deleted

## 2017-09-03 ENCOUNTER — Ambulatory Visit (INDEPENDENT_AMBULATORY_CARE_PROVIDER_SITE_OTHER): Payer: Medicare Other | Admitting: Emergency Medicine

## 2017-09-03 DIAGNOSIS — R911 Solitary pulmonary nodule: Secondary | ICD-10-CM | POA: Diagnosis not present

## 2017-09-03 DIAGNOSIS — R05 Cough: Secondary | ICD-10-CM | POA: Diagnosis not present

## 2017-09-03 DIAGNOSIS — R053 Chronic cough: Secondary | ICD-10-CM | POA: Insufficient documentation

## 2017-09-03 NOTE — Assessment & Plan Note (Signed)
Initiated by an upper respiratory infection in December, suspect that this is being driven by some subclinical GERD and possibly now also by some allergic rhinitis.  She responded in part to some GERD therapy, has had some difficulty tolerating PPI due to abdominal discomfort.  She is willing to take her Zantac on a schedule for 2 weeks.  I will also add an antihistamine.  We discussed strategies to avoid throat irritation.  Could consider some empiric prednisone but I will defer for now.  We will suppress her cough is able.  If the cough continues then she will need PFTs, possibly bronchoscopy for airway inspection.  Start taking your zantac 150mg  twice a day for two weeks straight. Then go back to using just as needed.  Start loratadine 10mg  daily for two weeks  Try to practice voice rest for a weekend Try using a sugar free candy to keep in your mouth. Avoid throat clearing.  You can use your codeine-based cough syrup up to every 6 hours if needed.  Use tessalon perles up to every 6 hours if needed for cough suppression.  Follow with Dr Lamonte Sakai in June to review your scan together.

## 2017-09-03 NOTE — Progress Notes (Signed)
Subjective:    Patient ID: Angela Juarez, female    DOB: 1945-11-09, 72 y.o.   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  She was evaluated for chronic cough by Dr. Lin Landsman at [facility] family physicians since 04/2017 after a URI. Seen again 07/25/17 and notes available > continued to have dry cough, had seen some scant hemoptysis. Prompted a CT chest 07/30/17 that I have reviewed, shows a small 10 mm discoid nodular type opacity in the right middle lobe posteriorly and inferiorly near the fissure.   The cough has persisted, was treated with azithro x 1. Was also treated with zantac, omeprazole at different times with some improvement but no full resolution. She has some L pectoral soreness.   She recalls having a PNA as a child age 41  Her father dies of lung cancer.    Review of Systems  Constitutional: Negative for fever and unexpected weight change.  HENT: Positive for congestion. Negative for dental problem, ear pain, nosebleeds, postnasal drip, rhinorrhea, sinus pressure, sneezing, sore throat and trouble swallowing.   Eyes: Negative for redness and itching.  Respiratory: Positive for cough. Negative for chest tightness, shortness of breath and wheezing.   Cardiovascular: Negative for palpitations and leg swelling.  Gastrointestinal: Negative for nausea and vomiting.  Genitourinary: Negative for dysuria.  Musculoskeletal: Negative for joint swelling.  Skin: Negative for rash.  Neurological: Negative for headaches.  Hematological: Does not bruise/bleed easily.  Psychiatric/Behavioral: Negative for dysphoric mood. The patient is not nervous/anxious.    Past Medical History:  Diagnosis Date  . Cancer (HCC)    squamous cell   . Osteoporosis   . Varicose veins      Family History  Problem Relation Age of Onset  . Stroke Father   . Cancer Father        cancer  . Varicose Veins  Father   . Bleeding Disorder Father   . Cancer Brother      Social History   Socioeconomic History  . Marital status: Married    Spouse name: Not on file  . Number of children: Not on file  . Years of education: Not on file  . Highest education level: Not on file  Occupational History  . Not on file  Social Needs  . Financial resource strain: Not on file  . Food insecurity:    Worry: Not on file    Inability: Not on file  . Transportation needs:    Medical: Not on file    Non-medical: Not on file  Tobacco Use  . Smoking status: Never Smoker  . Smokeless tobacco: Never Used  Substance and Sexual Activity  . Alcohol use: Yes    Alcohol/week: 0.0 oz    Comment: occ  . Drug use: No  . Sexual activity: Not on file  Lifestyle  . Physical activity:    Days per week: Not on file    Minutes per session: Not on file  . Stress: Not on file  Relationships  . Social connections:    Talks on phone: Not on file    Gets together: Not on file    Attends religious service: Not on file    Active member of club or organization: Not on file    Attends meetings of clubs or organizations: Not on file    Relationship status: Not on file  . Intimate partner violence:  Fear of current or ex partner: Not on file    Emotionally abused: Not on file    Physically abused: Not on file    Forced sexual activity: Not on file  Other Topics Concern  . Not on file  Social History Narrative  . Not on file  Has lived in [place], [place] She worked as a Engineer, maintenance (IT)  No known TB exposure, testing always negative.    No Known Allergies   Outpatient Medications Prior to Visit  Medication Sig Dispense Refill  . aspirin 81 MG tablet Take 81 mg by mouth daily.    . Calcium-Magnesium-Vitamin D (CALCIUM 500 PO) Take 1 tablet by mouth daily.    . cholecalciferol (VITAMIN D) 1000 units tablet Take 1,000 Units by mouth daily.    Marland Kitchen denosumab (PROLIA) 60 MG/ML SOLN injection Inject 60 mg into the skin every 6  (six) months. Administer in upper arm, thigh, or abdomen    . Magnesium 300 MG CAPS Take 1 capsule by mouth daily.    . Multiple Vitamin (MULTIVITAMIN) tablet Take 1 tablet by mouth daily.    . Saccharomyces boulardii (PROBIOTIC) 250 MG CAPS Take 1 capsule by mouth daily.     No facility-administered medications prior to visit.         Objective:   Physical Exam Vitals:   09/03/17 1508 09/03/17 1509  BP:  110/70  Pulse:  71  SpO2:  99%  Weight: 121 lb (54.9 kg)   Height: 5' 7.5" (1.715 m)    Gen: Pleasant, thin woman, in no distress,  normal affect, some throat clearing  ENT: No lesions,  mouth clear,  oropharynx clear, no postnasal drip  Neck: No JVD, no stridor  Lungs: No use of accessory muscles, no wheeze, crackles.   Cardiovascular: RRR, heart sounds normal, no murmur or gallops, no peripheral edema  Musculoskeletal: No deformities, no cyanosis or clubbing  Neuro: alert, non focal  Skin: Warm, no lesions or rash       Assessment & Plan:  Chronic cough Initiated by an upper respiratory infection in December, suspect that this is being driven by some subclinical GERD and possibly now also by some allergic rhinitis.  She responded in part to some GERD therapy, has had some difficulty tolerating PPI due to abdominal discomfort.  She is willing to take her Zantac on a schedule for 2 weeks.  I will also add an antihistamine.  We discussed strategies to avoid throat irritation.  Could consider some empiric prednisone but I will defer for now.  We will suppress her cough is able.  If the cough continues then she will need PFTs, possibly bronchoscopy for airway inspection.  Start taking your zantac 150mg  twice a day for two weeks straight. Then go back to using just as needed.  Start loratadine 10mg  daily for two weeks  Try to practice voice rest for a weekend Try using a sugar free candy to keep in your mouth. Avoid throat clearing.  You can use your codeine-based cough  syrup up to every 6 hours if needed.  Use tessalon perles up to every 6 hours if needed for cough suppression.  Follow with Dr Lamonte Sakai in June to review your scan together.   Pulmonary nodule, right Oblong 65mm nodule in the RML, pulling the fissure. Etiology unclear. Low risk patient but she does have a family hx lung CA. I believe she needs f/u scan in 3 months and then determine next steps.   Baltazar Apo, MD,  PhD 09/03/2017, 4:01 PM [facility] [phone] or if no answer [phone]

## 2017-09-03 NOTE — Patient Instructions (Addendum)
Start taking your zantac 150mg  twice a day for two weeks straight. Then go back to using just as needed.  Start loratadine 10mg  daily for two weeks  Try to practice voice rest for a weekend Try using a sugar free candy to keep in your mouth. Avoid throat clearing.  You can use your codeine-based cough syrup up to every 6 hours if needed.  Use tessalon perles up to every 6 hours if needed for cough suppression.  We will repeat your CT scan of the chest in early June to follow your pulmonary nodule.  Follow with Dr Lamonte Sakai in June to review your scan together.

## 2017-09-03 NOTE — Assessment & Plan Note (Signed)
Oblong 75mm nodule in the RML, pulling the fissure. Etiology unclear. Low risk patient but she does have a family hx lung CA. I believe she needs f/u scan in 3 months and then determine next steps.

## 2017-10-29 DIAGNOSIS — R911 Solitary pulmonary nodule: Secondary | ICD-10-CM | POA: Diagnosis not present

## 2017-11-05 ENCOUNTER — Ambulatory Visit (INDEPENDENT_AMBULATORY_CARE_PROVIDER_SITE_OTHER): Payer: Medicare Other | Admitting: Emergency Medicine

## 2017-11-05 ENCOUNTER — Encounter: Payer: Self-pay | Admitting: Emergency Medicine

## 2017-11-05 DIAGNOSIS — R05 Cough: Secondary | ICD-10-CM | POA: Diagnosis not present

## 2017-11-05 DIAGNOSIS — R911 Solitary pulmonary nodule: Secondary | ICD-10-CM

## 2017-11-05 DIAGNOSIS — R053 Chronic cough: Secondary | ICD-10-CM

## 2017-11-05 MED ORDER — BENZONATATE 100 MG PO CAPS: 100.0000 mg | ORAL_CAPSULE | Freq: Three times a day (TID) | ORAL | 1 refills | Status: DC | PRN

## 2017-11-05 NOTE — Assessment & Plan Note (Signed)
Almost completely resolved on repeat CT scan of the chest done this month.  Should not require any further follow-up in a low risk patient

## 2017-11-05 NOTE — Patient Instructions (Signed)
Your CT scan of the chest shows that your pulmonary nodule has almost completely resolved and there is only small amount of scar left present.  You will not need to have any repeat CT scans to follow this unless there is a clinical change. Please continue your Zantac as you are taking it.  Add back loratadine for 2 weeks.  You can then try to experiment with stopping 1 of the medications to see how your cough response. Try to avoid throat clearing if at all possible Use Tessalon Perles 100 mg up to every 8 hours if needed to suppress her cough. Follow with Dr Lamonte Sakai in 4 months or sooner if you have any problems.

## 2017-11-05 NOTE — Progress Notes (Signed)
Subjective:    Patient ID: Angela Juarez, female    DOB: 02/11/1946, 72 y.o.   MRN: [id number]  Cough  Pertinent negatives include no ear pain, eye redness, fever, headaches, postnasal drip, rash, rhinorrhea, sore throat, shortness of breath or wheezing.   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  She was evaluated for chronic cough by Dr. Lin Landsman at [facility] family physicians since 04/2017 after a URI. Seen again 07/25/17 and notes available > continued to have dry cough, had seen some scant hemoptysis. Prompted a CT chest 07/30/17 that I have reviewed, shows a small 10 mm discoid nodular type opacity in the right middle lobe posteriorly and inferiorly near the fissure.   The cough has persisted, was treated with azithro x 1. Was also treated with zantac, omeprazole at different times with some improvement but no full resolution. She has some L pectoral soreness.   She recalls having a PNA as a child age 74  Her father dies of lung cancer.   ROV 2017-11-13 --this is a follow-up visit for patient with a history of a small 10 mm pulmonary nodule in the right middle lobe posteriorly and inferiorly at the fissure.  She also has a history of chronic cough that we thought was driven by allergic rhinitis and possibly some subclinical GERD.  She underwent a repeat CT scan of the chest that I reviewed from [place].  This shows that the oblong nodular area has shrunk in size, is more linear and consistent with a scar at the pleural interface. She still has some cough, clears some clear mucous.    Review of Systems  Constitutional: Negative for fever and unexpected weight change.  HENT: Positive for congestion. Negative for dental problem, ear pain, nosebleeds, postnasal drip, rhinorrhea, sinus pressure, sneezing, sore throat and trouble swallowing.   Eyes: Negative for redness and itching.  Respiratory: Positive for cough.  Negative for chest tightness, shortness of breath and wheezing.   Cardiovascular: Negative for palpitations and leg swelling.  Gastrointestinal: Negative for nausea and vomiting.  Genitourinary: Negative for dysuria.  Musculoskeletal: Negative for joint swelling.  Skin: Negative for rash.  Neurological: Negative for headaches.  Hematological: Does not bruise/bleed easily.  Psychiatric/Behavioral: Negative for dysphoric mood. The patient is not nervous/anxious.    Past Medical History:  Diagnosis Date  . Cancer (HCC)    squamous cell   . Osteoporosis   . Varicose veins      Family History  Problem Relation Age of Onset  . Stroke Father   . Cancer Father        cancer  . Varicose Veins Father   . Bleeding Disorder Father   . Cancer Brother      Social History   Socioeconomic History  . Marital status: Married    Spouse name: Not on file  . Number of children: Not on file  . Years of education: Not on file  . Highest education level: Not on file  Occupational History  . Not on file  Social Needs  . Financial resource strain: Not on file  . Food insecurity:    Worry: Not on file    Inability: Not on file  . Transportation needs:    Medical: Not on file    Non-medical: Not on file  Tobacco Use  . Smoking status: Never Smoker  . Smokeless tobacco: Never Used  Substance and Sexual Activity  .  Alcohol use: Yes    Alcohol/week: 0.0 oz    Comment: occ  . Drug use: No  . Sexual activity: Not on file  Lifestyle  . Physical activity:    Days per week: Not on file    Minutes per session: Not on file  . Stress: Not on file  Relationships  . Social connections:    Talks on phone: Not on file    Gets together: Not on file    Attends religious service: Not on file    Active member of club or organization: Not on file    Attends meetings of clubs or organizations: Not on file    Relationship status: Not on file  . Intimate partner violence:    Fear of current or ex  partner: Not on file    Emotionally abused: Not on file    Physically abused: Not on file    Forced sexual activity: Not on file  Other Topics Concern  . Not on file  Social History Narrative  . Not on file  Has lived in [place], [place] She worked as a Engineer, maintenance (IT)  No known TB exposure, testing always negative.    No Known Allergies   Outpatient Medications Prior to Visit  Medication Sig Dispense Refill  . aspirin 81 MG tablet Take 81 mg by mouth daily.    . Calcium-Magnesium-Vitamin D (CALCIUM 500 PO) Take 1 tablet by mouth daily.    . cholecalciferol (VITAMIN D) 1000 units tablet Take 1,000 Units by mouth daily.    Marland Kitchen denosumab (PROLIA) 60 MG/ML SOLN injection Inject 60 mg into the skin every 6 (six) months. Administer in upper arm, thigh, or abdomen    . Magnesium 300 MG CAPS Take 1 capsule by mouth daily.    . Saccharomyces boulardii (PROBIOTIC) 250 MG CAPS Take 1 capsule by mouth daily.    . Multiple Vitamin (MULTIVITAMIN) tablet Take 1 tablet by mouth daily.     No facility-administered medications prior to visit.         Objective:   Physical Exam Vitals:   11/05/17 0936  BP: 110/66  Pulse: 64  SpO2: 98%  Weight: 121 lb (54.9 kg)  Height: 5' 7.5" (1.715 m)   Gen: Pleasant, thin woman, in no distress,  normal affect, some throat clearing  ENT: No lesions,  mouth clear,  oropharynx clear, no postnasal drip  Neck: No JVD, no stridor  Lungs: No use of accessory muscles, no wheeze, crackles.   Cardiovascular: RRR, heart sounds normal, no murmur or gallops, no peripheral edema  Musculoskeletal: No deformities, no cyanosis or clubbing  Neuro: alert, non focal  Skin: Warm, no lesions or rash       Assessment & Plan:  Pulmonary nodule, right Almost completely resolved on repeat CT scan of the chest done this month.  Should not require any further follow-up in a low risk patient  Chronic cough With contributions of both low-grade GERD and allergic rhinitis.  She  had improved some when she was on both Zantac and loratadine.  She now back to a daily annoying cough with a lot of throat clearing, some clear mucus production.  We will try to reinitiate Zantac and loratadine, she can try stopping one or both to see if she misses them once the cough is better control.  We talked about avoiding throat clearing.  We also talked about trying to prevent catching a URI, etc. we will use Tessalon Perles for cough suppression.  Follow-up in  4 months to assess her progress.  If she continued cough without explanation then we may decide to perform bronchoscopy for airway inspection.  Baltazar Apo, MD, PhD 11/05/2017, 9:59 AM [facility] [phone] or if no answer [phone]

## 2017-11-05 NOTE — Assessment & Plan Note (Signed)
With contributions of both low-grade GERD and allergic rhinitis.  She had improved some when she was on both Zantac and loratadine.  She now back to a daily annoying cough with a lot of throat clearing, some clear mucus production.  We will try to reinitiate Zantac and loratadine, she can try stopping one or both to see if she misses them once the cough is better control.  We talked about avoiding throat clearing.  We also talked about trying to prevent catching a URI, etc. we will use Tessalon Perles for cough suppression.  Follow-up in 4 months to assess her progress.  If she continued cough without explanation then we may decide to perform bronchoscopy for airway inspection.

## 2017-12-20 DIAGNOSIS — Z0000 Encounter for general adult medical examination without abnormal findings: Secondary | ICD-10-CM | POA: Diagnosis not present

## 2017-12-20 DIAGNOSIS — Z1239 Encounter for other screening for malignant neoplasm of breast: Secondary | ICD-10-CM | POA: Diagnosis not present

## 2017-12-20 DIAGNOSIS — Z1331 Encounter for screening for depression: Secondary | ICD-10-CM | POA: Diagnosis not present

## 2017-12-20 DIAGNOSIS — Z9889 Other specified postprocedural states: Secondary | ICD-10-CM | POA: Diagnosis not present

## 2017-12-20 DIAGNOSIS — Z124 Encounter for screening for malignant neoplasm of cervix: Secondary | ICD-10-CM | POA: Diagnosis not present

## 2018-01-07 DIAGNOSIS — Z1231 Encounter for screening mammogram for malignant neoplasm of breast: Secondary | ICD-10-CM | POA: Diagnosis not present

## 2018-01-18 DIAGNOSIS — M818 Other osteoporosis without current pathological fracture: Secondary | ICD-10-CM | POA: Diagnosis not present

## 2018-02-11 DIAGNOSIS — Z681 Body mass index (BMI) 19 or less, adult: Secondary | ICD-10-CM | POA: Diagnosis not present

## 2018-02-11 DIAGNOSIS — J329 Chronic sinusitis, unspecified: Secondary | ICD-10-CM | POA: Diagnosis not present

## 2018-02-11 DIAGNOSIS — J40 Bronchitis, not specified as acute or chronic: Secondary | ICD-10-CM | POA: Diagnosis not present

## 2018-02-11 DIAGNOSIS — Z0000 Encounter for general adult medical examination without abnormal findings: Secondary | ICD-10-CM | POA: Diagnosis not present

## 2018-03-04 DIAGNOSIS — Z23 Encounter for immunization: Secondary | ICD-10-CM | POA: Diagnosis not present

## 2018-03-09 DIAGNOSIS — J309 Allergic rhinitis, unspecified: Secondary | ICD-10-CM | POA: Diagnosis not present

## 2018-03-09 DIAGNOSIS — J209 Acute bronchitis, unspecified: Secondary | ICD-10-CM | POA: Diagnosis not present

## 2018-03-09 DIAGNOSIS — R9082 White matter disease, unspecified: Secondary | ICD-10-CM | POA: Diagnosis not present

## 2018-03-13 DIAGNOSIS — J329 Chronic sinusitis, unspecified: Secondary | ICD-10-CM | POA: Diagnosis not present

## 2018-03-13 DIAGNOSIS — R0982 Postnasal drip: Secondary | ICD-10-CM | POA: Diagnosis not present

## 2018-03-13 DIAGNOSIS — J40 Bronchitis, not specified as acute or chronic: Secondary | ICD-10-CM | POA: Diagnosis not present

## 2018-03-13 DIAGNOSIS — J309 Allergic rhinitis, unspecified: Secondary | ICD-10-CM | POA: Diagnosis not present

## 2018-04-15 DIAGNOSIS — J309 Allergic rhinitis, unspecified: Secondary | ICD-10-CM | POA: Diagnosis not present

## 2018-04-15 DIAGNOSIS — R0982 Postnasal drip: Secondary | ICD-10-CM | POA: Diagnosis not present

## 2018-04-15 DIAGNOSIS — J329 Chronic sinusitis, unspecified: Secondary | ICD-10-CM | POA: Diagnosis not present

## 2018-04-15 DIAGNOSIS — Z681 Body mass index (BMI) 19 or less, adult: Secondary | ICD-10-CM | POA: Diagnosis not present

## 2018-07-24 DIAGNOSIS — Z7982 Long term (current) use of aspirin: Secondary | ICD-10-CM | POA: Diagnosis not present

## 2018-07-24 DIAGNOSIS — M810 Age-related osteoporosis without current pathological fracture: Secondary | ICD-10-CM | POA: Diagnosis not present

## 2018-07-24 DIAGNOSIS — M818 Other osteoporosis without current pathological fracture: Secondary | ICD-10-CM | POA: Diagnosis not present

## 2018-07-31 DIAGNOSIS — M810 Age-related osteoporosis without current pathological fracture: Secondary | ICD-10-CM | POA: Diagnosis not present

## 2018-09-16 DIAGNOSIS — M810 Age-related osteoporosis without current pathological fracture: Secondary | ICD-10-CM | POA: Diagnosis not present

## 2018-09-16 DIAGNOSIS — Z681 Body mass index (BMI) 19 or less, adult: Secondary | ICD-10-CM | POA: Diagnosis not present

## 2018-09-16 DIAGNOSIS — I8390 Asymptomatic varicose veins of unspecified lower extremity: Secondary | ICD-10-CM | POA: Diagnosis not present

## 2018-09-16 DIAGNOSIS — M25511 Pain in right shoulder: Secondary | ICD-10-CM | POA: Diagnosis not present

## 2018-09-17 DIAGNOSIS — M7501 Adhesive capsulitis of right shoulder: Secondary | ICD-10-CM | POA: Diagnosis not present

## 2018-12-25 ENCOUNTER — Other Ambulatory Visit: Payer: Self-pay

## 2019-01-17 DIAGNOSIS — Z8481 Family history of carrier of genetic disease: Secondary | ICD-10-CM | POA: Diagnosis not present

## 2019-01-17 DIAGNOSIS — Z800 Family history of malignant neoplasm of digestive organs: Secondary | ICD-10-CM | POA: Diagnosis not present

## 2019-01-17 DIAGNOSIS — K219 Gastro-esophageal reflux disease without esophagitis: Secondary | ICD-10-CM | POA: Diagnosis not present

## 2019-01-24 DIAGNOSIS — Z1159 Encounter for screening for other viral diseases: Secondary | ICD-10-CM | POA: Diagnosis not present

## 2019-01-30 DIAGNOSIS — D123 Benign neoplasm of transverse colon: Secondary | ICD-10-CM | POA: Diagnosis not present

## 2019-01-30 DIAGNOSIS — D126 Benign neoplasm of colon, unspecified: Secondary | ICD-10-CM | POA: Diagnosis not present

## 2019-01-30 DIAGNOSIS — K449 Diaphragmatic hernia without obstruction or gangrene: Secondary | ICD-10-CM | POA: Diagnosis not present

## 2019-01-30 DIAGNOSIS — K208 Other esophagitis: Secondary | ICD-10-CM | POA: Diagnosis not present

## 2019-01-30 DIAGNOSIS — K635 Polyp of colon: Secondary | ICD-10-CM | POA: Diagnosis not present

## 2019-01-30 DIAGNOSIS — Z800 Family history of malignant neoplasm of digestive organs: Secondary | ICD-10-CM | POA: Diagnosis not present

## 2019-01-30 DIAGNOSIS — Z8481 Family history of carrier of genetic disease: Secondary | ICD-10-CM | POA: Diagnosis not present

## 2019-01-30 DIAGNOSIS — K5520 Angiodysplasia of colon without hemorrhage: Secondary | ICD-10-CM | POA: Diagnosis not present

## 2019-01-30 DIAGNOSIS — K5730 Diverticulosis of large intestine without perforation or abscess without bleeding: Secondary | ICD-10-CM | POA: Diagnosis not present

## 2019-01-30 DIAGNOSIS — Z1211 Encounter for screening for malignant neoplasm of colon: Secondary | ICD-10-CM | POA: Diagnosis not present

## 2019-01-30 DIAGNOSIS — K209 Esophagitis, unspecified: Secondary | ICD-10-CM | POA: Diagnosis not present

## 2019-01-30 LAB — HM COLONOSCOPY

## 2019-02-28 DIAGNOSIS — Z23 Encounter for immunization: Secondary | ICD-10-CM | POA: Diagnosis not present

## 2019-04-05 DIAGNOSIS — Z8049 Family history of malignant neoplasm of other genital organs: Secondary | ICD-10-CM | POA: Diagnosis not present

## 2019-04-05 DIAGNOSIS — Z8481 Family history of carrier of genetic disease: Secondary | ICD-10-CM | POA: Diagnosis not present

## 2019-04-05 DIAGNOSIS — Z800 Family history of malignant neoplasm of digestive organs: Secondary | ICD-10-CM | POA: Diagnosis not present

## 2019-06-30 DIAGNOSIS — Z1331 Encounter for screening for depression: Secondary | ICD-10-CM | POA: Diagnosis not present

## 2019-06-30 DIAGNOSIS — E559 Vitamin D deficiency, unspecified: Secondary | ICD-10-CM | POA: Diagnosis not present

## 2019-06-30 DIAGNOSIS — Z79899 Other long term (current) drug therapy: Secondary | ICD-10-CM | POA: Diagnosis not present

## 2019-06-30 DIAGNOSIS — Z13220 Encounter for screening for lipoid disorders: Secondary | ICD-10-CM | POA: Diagnosis not present

## 2019-06-30 DIAGNOSIS — Z681 Body mass index (BMI) 19 or less, adult: Secondary | ICD-10-CM | POA: Diagnosis not present

## 2019-06-30 DIAGNOSIS — Z9181 History of falling: Secondary | ICD-10-CM | POA: Diagnosis not present

## 2019-06-30 DIAGNOSIS — M1990 Unspecified osteoarthritis, unspecified site: Secondary | ICD-10-CM | POA: Diagnosis not present

## 2019-06-30 DIAGNOSIS — R5383 Other fatigue: Secondary | ICD-10-CM | POA: Diagnosis not present

## 2019-06-30 DIAGNOSIS — Z0000 Encounter for general adult medical examination without abnormal findings: Secondary | ICD-10-CM | POA: Diagnosis not present

## 2019-07-14 DIAGNOSIS — Z803 Family history of malignant neoplasm of breast: Secondary | ICD-10-CM | POA: Diagnosis not present

## 2019-07-14 DIAGNOSIS — Z800 Family history of malignant neoplasm of digestive organs: Secondary | ICD-10-CM | POA: Diagnosis not present

## 2019-07-14 DIAGNOSIS — Z8481 Family history of carrier of genetic disease: Secondary | ICD-10-CM | POA: Diagnosis not present

## 2019-07-14 DIAGNOSIS — Z807 Family history of other malignant neoplasms of lymphoid, hematopoietic and related tissues: Secondary | ICD-10-CM | POA: Diagnosis not present

## 2019-07-14 DIAGNOSIS — N858 Other specified noninflammatory disorders of uterus: Secondary | ICD-10-CM | POA: Diagnosis not present

## 2019-07-14 DIAGNOSIS — Z808 Family history of malignant neoplasm of other organs or systems: Secondary | ICD-10-CM | POA: Diagnosis not present

## 2019-07-14 DIAGNOSIS — Z801 Family history of malignant neoplasm of trachea, bronchus and lung: Secondary | ICD-10-CM | POA: Diagnosis not present

## 2019-07-14 DIAGNOSIS — Z1509 Genetic susceptibility to other malignant neoplasm: Secondary | ICD-10-CM | POA: Diagnosis not present

## 2019-07-14 DIAGNOSIS — Z8049 Family history of malignant neoplasm of other genital organs: Secondary | ICD-10-CM | POA: Diagnosis not present

## 2019-07-23 DIAGNOSIS — Z1509 Genetic susceptibility to other malignant neoplasm: Secondary | ICD-10-CM | POA: Diagnosis not present

## 2019-07-23 DIAGNOSIS — Z800 Family history of malignant neoplasm of digestive organs: Secondary | ICD-10-CM | POA: Diagnosis not present

## 2019-12-31 DIAGNOSIS — Z1231 Encounter for screening mammogram for malignant neoplasm of breast: Secondary | ICD-10-CM | POA: Diagnosis not present

## 2020-01-14 ENCOUNTER — Other Ambulatory Visit: Payer: Self-pay | Admitting: Family Medicine

## 2020-01-14 DIAGNOSIS — R928 Other abnormal and inconclusive findings on diagnostic imaging of breast: Secondary | ICD-10-CM

## 2020-01-26 DIAGNOSIS — Z20828 Contact with and (suspected) exposure to other viral communicable diseases: Secondary | ICD-10-CM | POA: Diagnosis not present

## 2020-01-26 DIAGNOSIS — Z20822 Contact with and (suspected) exposure to covid-19: Secondary | ICD-10-CM | POA: Diagnosis not present

## 2020-02-04 ENCOUNTER — Other Ambulatory Visit: Payer: Self-pay

## 2020-02-04 ENCOUNTER — Ambulatory Visit
Admission: RE | Admit: 2020-02-04 | Discharge: 2020-02-04 | Disposition: A | Discharge status: Home / Self Care | Payer: Medicare Other | Source: Ambulatory Visit | Attending: Family Medicine | Admitting: Family Medicine

## 2020-02-04 ENCOUNTER — Ambulatory Visit: Payer: Medicare Other

## 2020-02-04 DIAGNOSIS — R928 Other abnormal and inconclusive findings on diagnostic imaging of breast: Secondary | ICD-10-CM

## 2020-03-04 DIAGNOSIS — Z23 Encounter for immunization: Secondary | ICD-10-CM | POA: Diagnosis not present

## 2020-06-07 DIAGNOSIS — J40 Bronchitis, not specified as acute or chronic: Secondary | ICD-10-CM | POA: Diagnosis not present

## 2020-06-07 DIAGNOSIS — J302 Other seasonal allergic rhinitis: Secondary | ICD-10-CM | POA: Diagnosis not present

## 2020-06-07 DIAGNOSIS — J329 Chronic sinusitis, unspecified: Secondary | ICD-10-CM | POA: Diagnosis not present

## 2020-06-07 DIAGNOSIS — Z20828 Contact with and (suspected) exposure to other viral communicable diseases: Secondary | ICD-10-CM | POA: Diagnosis not present

## 2020-07-21 DIAGNOSIS — M25651 Stiffness of right hip, not elsewhere classified: Secondary | ICD-10-CM | POA: Diagnosis not present

## 2020-07-21 DIAGNOSIS — R2689 Other abnormalities of gait and mobility: Secondary | ICD-10-CM | POA: Diagnosis not present

## 2020-07-21 DIAGNOSIS — M25551 Pain in right hip: Secondary | ICD-10-CM | POA: Diagnosis not present

## 2020-07-21 DIAGNOSIS — R531 Weakness: Secondary | ICD-10-CM | POA: Diagnosis not present

## 2020-07-28 DIAGNOSIS — M25651 Stiffness of right hip, not elsewhere classified: Secondary | ICD-10-CM | POA: Diagnosis not present

## 2020-07-28 DIAGNOSIS — R2689 Other abnormalities of gait and mobility: Secondary | ICD-10-CM | POA: Diagnosis not present

## 2020-07-28 DIAGNOSIS — R531 Weakness: Secondary | ICD-10-CM | POA: Diagnosis not present

## 2020-07-28 DIAGNOSIS — M25551 Pain in right hip: Secondary | ICD-10-CM | POA: Diagnosis not present

## 2020-08-18 DIAGNOSIS — Z1331 Encounter for screening for depression: Secondary | ICD-10-CM | POA: Diagnosis not present

## 2020-08-18 DIAGNOSIS — Z0000 Encounter for general adult medical examination without abnormal findings: Secondary | ICD-10-CM | POA: Diagnosis not present

## 2020-09-14 DIAGNOSIS — Z23 Encounter for immunization: Secondary | ICD-10-CM | POA: Diagnosis not present

## 2020-09-27 DIAGNOSIS — M25651 Stiffness of right hip, not elsewhere classified: Secondary | ICD-10-CM | POA: Diagnosis not present

## 2020-09-27 DIAGNOSIS — M25551 Pain in right hip: Secondary | ICD-10-CM | POA: Diagnosis not present

## 2020-09-27 DIAGNOSIS — R2689 Other abnormalities of gait and mobility: Secondary | ICD-10-CM | POA: Diagnosis not present

## 2020-09-27 DIAGNOSIS — R531 Weakness: Secondary | ICD-10-CM | POA: Diagnosis not present

## 2020-09-29 DIAGNOSIS — Z7983 Long term (current) use of bisphosphonates: Secondary | ICD-10-CM | POA: Diagnosis not present

## 2020-09-29 DIAGNOSIS — M85852 Other specified disorders of bone density and structure, left thigh: Secondary | ICD-10-CM | POA: Diagnosis not present

## 2020-09-29 DIAGNOSIS — M818 Other osteoporosis without current pathological fracture: Secondary | ICD-10-CM | POA: Diagnosis not present

## 2020-09-29 DIAGNOSIS — Z7982 Long term (current) use of aspirin: Secondary | ICD-10-CM | POA: Diagnosis not present

## 2020-09-29 DIAGNOSIS — M810 Age-related osteoporosis without current pathological fracture: Secondary | ICD-10-CM | POA: Diagnosis not present

## 2020-10-11 DIAGNOSIS — M25551 Pain in right hip: Secondary | ICD-10-CM | POA: Diagnosis not present

## 2020-10-11 DIAGNOSIS — R531 Weakness: Secondary | ICD-10-CM | POA: Diagnosis not present

## 2020-10-11 DIAGNOSIS — R2689 Other abnormalities of gait and mobility: Secondary | ICD-10-CM | POA: Diagnosis not present

## 2020-10-11 DIAGNOSIS — M25651 Stiffness of right hip, not elsewhere classified: Secondary | ICD-10-CM | POA: Diagnosis not present

## 2020-10-13 DIAGNOSIS — Z79899 Other long term (current) drug therapy: Secondary | ICD-10-CM | POA: Diagnosis not present

## 2020-10-13 DIAGNOSIS — M810 Age-related osteoporosis without current pathological fracture: Secondary | ICD-10-CM | POA: Diagnosis not present

## 2021-01-12 DIAGNOSIS — D225 Melanocytic nevi of trunk: Secondary | ICD-10-CM | POA: Diagnosis not present

## 2021-01-12 DIAGNOSIS — L84 Corns and callosities: Secondary | ICD-10-CM | POA: Diagnosis not present

## 2021-01-12 DIAGNOSIS — L821 Other seborrheic keratosis: Secondary | ICD-10-CM | POA: Diagnosis not present

## 2021-01-12 DIAGNOSIS — D2262 Melanocytic nevi of left upper limb, including shoulder: Secondary | ICD-10-CM | POA: Diagnosis not present

## 2021-01-12 DIAGNOSIS — L738 Other specified follicular disorders: Secondary | ICD-10-CM | POA: Diagnosis not present

## 2021-01-12 DIAGNOSIS — D2261 Melanocytic nevi of right upper limb, including shoulder: Secondary | ICD-10-CM | POA: Diagnosis not present

## 2021-03-01 DIAGNOSIS — Z23 Encounter for immunization: Secondary | ICD-10-CM | POA: Diagnosis not present

## 2021-03-04 DIAGNOSIS — S40022A Contusion of left upper arm, initial encounter: Secondary | ICD-10-CM | POA: Diagnosis not present

## 2021-03-11 DIAGNOSIS — U071 COVID-19: Secondary | ICD-10-CM | POA: Diagnosis not present

## 2021-03-17 DIAGNOSIS — Z1509 Genetic susceptibility to other malignant neoplasm: Secondary | ICD-10-CM | POA: Diagnosis not present

## 2021-03-17 DIAGNOSIS — K5730 Diverticulosis of large intestine without perforation or abscess without bleeding: Secondary | ICD-10-CM | POA: Diagnosis not present

## 2021-03-17 DIAGNOSIS — D123 Benign neoplasm of transverse colon: Secondary | ICD-10-CM | POA: Diagnosis not present

## 2021-03-17 DIAGNOSIS — Z800 Family history of malignant neoplasm of digestive organs: Secondary | ICD-10-CM | POA: Diagnosis not present

## 2021-03-17 DIAGNOSIS — K5520 Angiodysplasia of colon without hemorrhage: Secondary | ICD-10-CM | POA: Diagnosis not present

## 2021-03-17 DIAGNOSIS — K5521 Angiodysplasia of colon with hemorrhage: Secondary | ICD-10-CM | POA: Diagnosis not present

## 2021-03-17 DIAGNOSIS — Z86010 Personal history of colonic polyps: Secondary | ICD-10-CM | POA: Diagnosis not present

## 2021-03-17 DIAGNOSIS — Z1211 Encounter for screening for malignant neoplasm of colon: Secondary | ICD-10-CM | POA: Diagnosis not present

## 2021-03-17 LAB — HM COLONOSCOPY

## 2021-03-21 DIAGNOSIS — Z23 Encounter for immunization: Secondary | ICD-10-CM | POA: Diagnosis not present

## 2021-04-11 DIAGNOSIS — U071 COVID-19: Secondary | ICD-10-CM | POA: Diagnosis not present

## 2021-04-18 DIAGNOSIS — M810 Age-related osteoporosis without current pathological fracture: Secondary | ICD-10-CM | POA: Diagnosis not present

## 2021-05-11 DIAGNOSIS — U071 COVID-19: Secondary | ICD-10-CM | POA: Diagnosis not present

## 2021-06-01 DIAGNOSIS — H26492 Other secondary cataract, left eye: Secondary | ICD-10-CM | POA: Diagnosis not present

## 2021-06-01 DIAGNOSIS — H52203 Unspecified astigmatism, bilateral: Secondary | ICD-10-CM | POA: Diagnosis not present

## 2021-06-01 DIAGNOSIS — H35373 Puckering of macula, bilateral: Secondary | ICD-10-CM | POA: Diagnosis not present

## 2021-06-01 DIAGNOSIS — Z961 Presence of intraocular lens: Secondary | ICD-10-CM | POA: Diagnosis not present

## 2021-06-15 DIAGNOSIS — U071 COVID-19: Secondary | ICD-10-CM | POA: Diagnosis not present

## 2021-06-21 DIAGNOSIS — H3581 Retinal edema: Secondary | ICD-10-CM | POA: Diagnosis not present

## 2021-06-21 DIAGNOSIS — H43393 Other vitreous opacities, bilateral: Secondary | ICD-10-CM | POA: Diagnosis not present

## 2021-06-21 DIAGNOSIS — H35373 Puckering of macula, bilateral: Secondary | ICD-10-CM | POA: Diagnosis not present

## 2021-06-21 DIAGNOSIS — H43813 Vitreous degeneration, bilateral: Secondary | ICD-10-CM | POA: Diagnosis not present

## 2021-06-30 DIAGNOSIS — H35371 Puckering of macula, right eye: Secondary | ICD-10-CM | POA: Diagnosis not present

## 2021-06-30 DIAGNOSIS — H3581 Retinal edema: Secondary | ICD-10-CM | POA: Diagnosis not present

## 2021-07-01 DIAGNOSIS — H35371 Puckering of macula, right eye: Secondary | ICD-10-CM | POA: Diagnosis not present

## 2021-07-08 DIAGNOSIS — H35373 Puckering of macula, bilateral: Secondary | ICD-10-CM | POA: Diagnosis not present

## 2021-07-24 IMAGING — MG MM DIGITAL DIAGNOSTIC UNILAT*L* W/ TOMO W/ CAD
6 series · 6 of 18 positions shown · non-contrast
Comparison: Previous exam(s).

CLINICAL DATA: 74-year-old female for further evaluation of
possible LEFT breast distortion on screening mammogram.

EXAM:
DIGITAL DIAGNOSTIC UNILATERAL LEFT MAMMOGRAM WITH TOMO AND CAD

[L ML synth-2D]
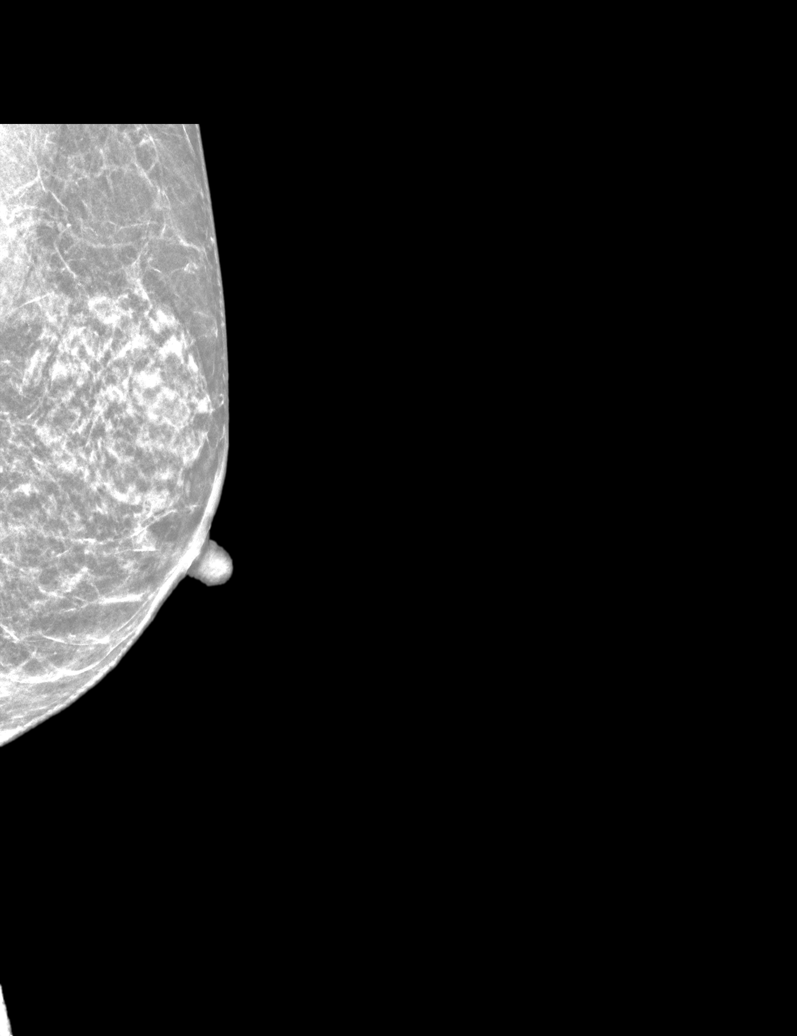

[L MLO synth-2D]
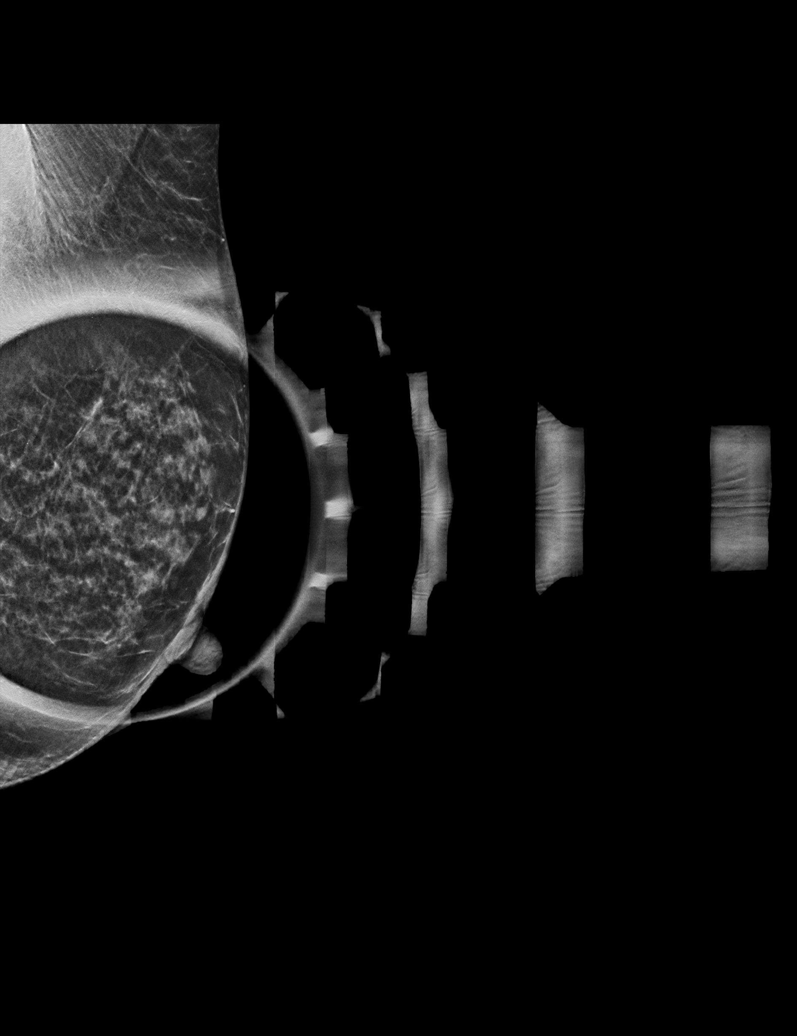

[L CC synth-2D]
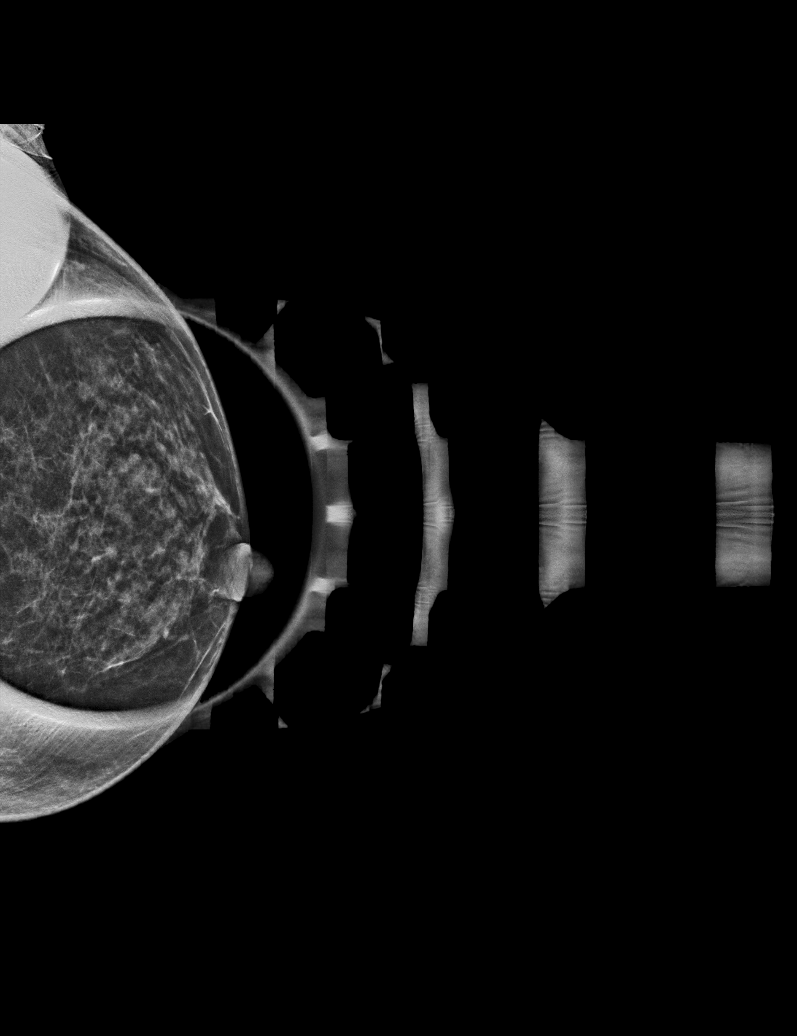

[L CC tomo · tomo slice 21/42.0]
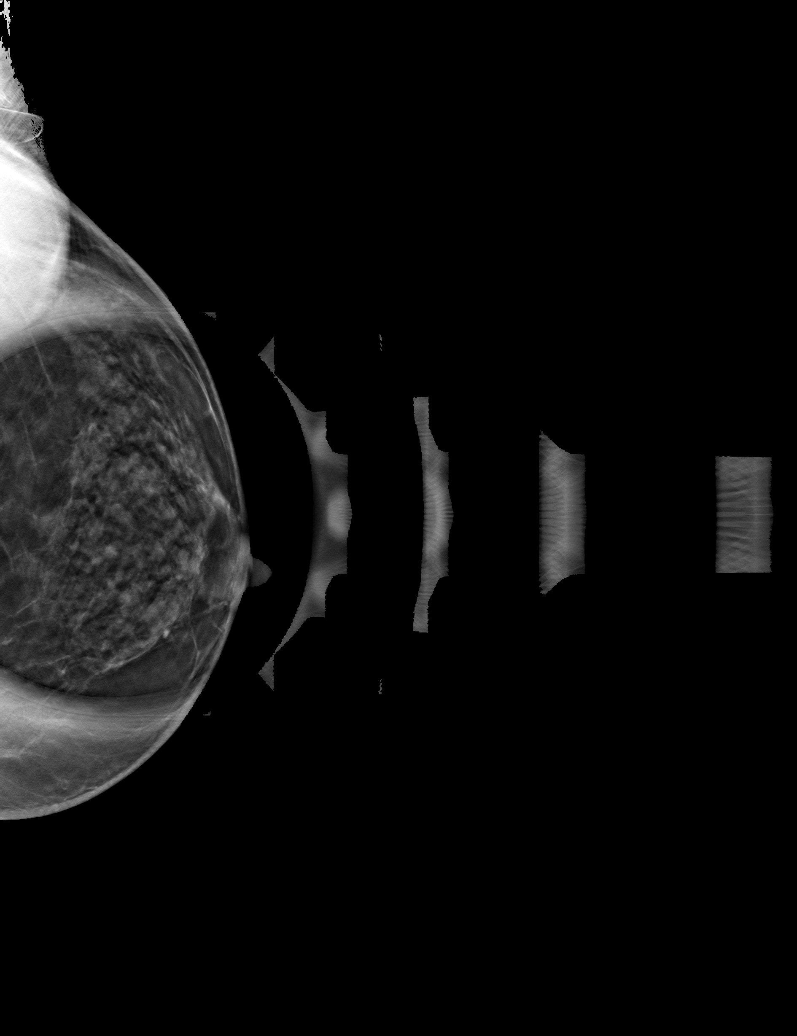

[L MLO tomo · tomo slice 20/39.0]
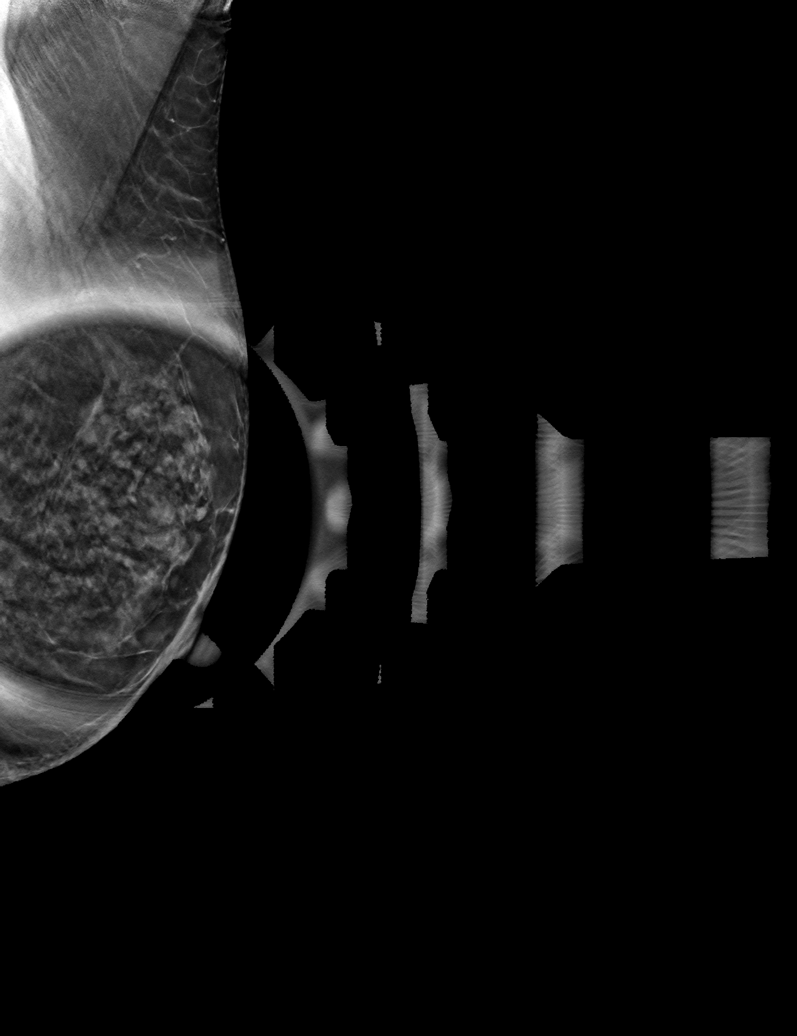

[L ML tomo · tomo slice 19/38.0]
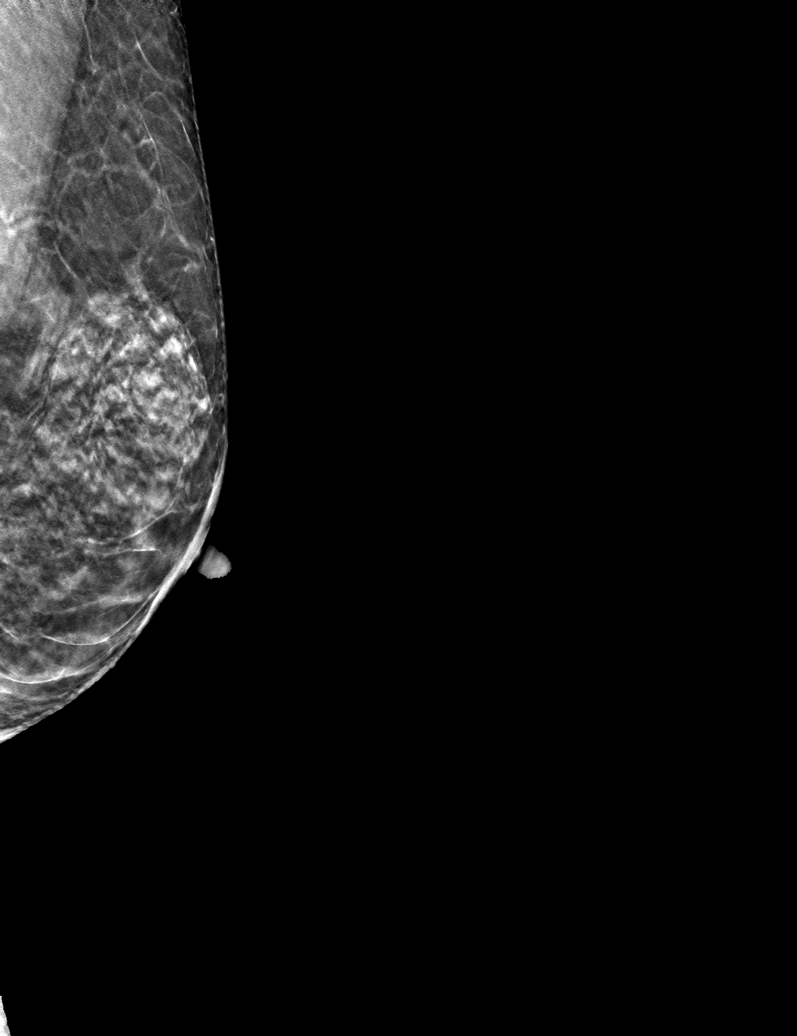

[6 of 18 positions shown; findings below may reference images not displayed]

ACR Breast Density Category c: The breast tissue is heterogeneously
dense, which may obscure small masses.
FINDINGS: 2D/3D full field and spot compression views of the LEFT breast
demonstrate no persistent abnormality at the site of the screening
study finding.

Mammographic images were processed with CAD.
IMPRESSION: No persistent abnormality at the site of the screening study
finding.

RECOMMENDATION:
Bilateral screening mammogram in 1 year.

I have discussed the findings and recommendations with the patient.
If applicable, a reminder letter will be sent to the patient
regarding the next appointment.

BI-RADS CATEGORY  1: Negative.

## 2021-07-29 DIAGNOSIS — H35372 Puckering of macula, left eye: Secondary | ICD-10-CM | POA: Diagnosis not present

## 2021-07-29 DIAGNOSIS — H3581 Retinal edema: Secondary | ICD-10-CM | POA: Diagnosis not present

## 2021-09-20 DIAGNOSIS — Z1509 Genetic susceptibility to other malignant neoplasm: Secondary | ICD-10-CM | POA: Diagnosis not present

## 2021-09-20 DIAGNOSIS — Z1231 Encounter for screening mammogram for malignant neoplasm of breast: Secondary | ICD-10-CM | POA: Diagnosis not present

## 2021-09-20 DIAGNOSIS — Z681 Body mass index (BMI) 19 or less, adult: Secondary | ICD-10-CM | POA: Diagnosis not present

## 2021-09-20 DIAGNOSIS — Z124 Encounter for screening for malignant neoplasm of cervix: Secondary | ICD-10-CM | POA: Diagnosis not present

## 2021-09-27 DIAGNOSIS — Z1509 Genetic susceptibility to other malignant neoplasm: Secondary | ICD-10-CM | POA: Diagnosis not present

## 2021-10-19 DIAGNOSIS — Z681 Body mass index (BMI) 19 or less, adult: Secondary | ICD-10-CM | POA: Diagnosis not present

## 2021-10-19 DIAGNOSIS — M810 Age-related osteoporosis without current pathological fracture: Secondary | ICD-10-CM | POA: Diagnosis not present

## 2021-10-19 DIAGNOSIS — M8440XA Pathological fracture, unspecified site, initial encounter for fracture: Secondary | ICD-10-CM | POA: Diagnosis not present

## 2021-10-19 DIAGNOSIS — Z7982 Long term (current) use of aspirin: Secondary | ICD-10-CM | POA: Diagnosis not present

## 2021-11-01 DIAGNOSIS — Z0000 Encounter for general adult medical examination without abnormal findings: Secondary | ICD-10-CM | POA: Diagnosis not present

## 2021-11-01 DIAGNOSIS — L259 Unspecified contact dermatitis, unspecified cause: Secondary | ICD-10-CM | POA: Diagnosis not present

## 2021-11-01 DIAGNOSIS — Z681 Body mass index (BMI) 19 or less, adult: Secondary | ICD-10-CM | POA: Diagnosis not present

## 2021-12-07 ENCOUNTER — Telehealth: Payer: Self-pay | Admitting: Internal Medicine

## 2021-12-07 DIAGNOSIS — H43392 Other vitreous opacities, left eye: Secondary | ICD-10-CM | POA: Diagnosis not present

## 2021-12-07 DIAGNOSIS — H43812 Vitreous degeneration, left eye: Secondary | ICD-10-CM | POA: Diagnosis not present

## 2021-12-07 DIAGNOSIS — H35372 Puckering of macula, left eye: Secondary | ICD-10-CM | POA: Diagnosis not present

## 2021-12-07 DIAGNOSIS — H35363 Drusen (degenerative) of macula, bilateral: Secondary | ICD-10-CM | POA: Diagnosis not present

## 2021-12-07 NOTE — Telephone Encounter (Signed)
Ok for appt  

## 2021-12-07 NOTE — Telephone Encounter (Signed)
Hi Dr. Hilarie Fredrickson,  This patient has requested a transfer of care to you.  She previously saw Dr. Earlean Shawl but she wants to establish care with [facility] and [facility].  Her records are in Epic for your review and she hand-delivered her records as well which will also be sent to you.  Please let me know if you accept the transfer.  Thank you.

## 2022-01-12 DIAGNOSIS — D1801 Hemangioma of skin and subcutaneous tissue: Secondary | ICD-10-CM | POA: Diagnosis not present

## 2022-01-12 DIAGNOSIS — L400 Psoriasis vulgaris: Secondary | ICD-10-CM | POA: Diagnosis not present

## 2022-01-12 DIAGNOSIS — L821 Other seborrheic keratosis: Secondary | ICD-10-CM | POA: Diagnosis not present

## 2022-01-12 DIAGNOSIS — Z85828 Personal history of other malignant neoplasm of skin: Secondary | ICD-10-CM | POA: Diagnosis not present

## 2022-01-12 DIAGNOSIS — D225 Melanocytic nevi of trunk: Secondary | ICD-10-CM | POA: Diagnosis not present

## 2022-03-14 DIAGNOSIS — M545 Low back pain, unspecified: Secondary | ICD-10-CM | POA: Diagnosis not present

## 2022-03-14 DIAGNOSIS — Z681 Body mass index (BMI) 19 or less, adult: Secondary | ICD-10-CM | POA: Diagnosis not present

## 2022-03-28 DIAGNOSIS — Z23 Encounter for immunization: Secondary | ICD-10-CM | POA: Diagnosis not present

## 2022-04-24 DIAGNOSIS — M810 Age-related osteoporosis without current pathological fracture: Secondary | ICD-10-CM | POA: Diagnosis not present

## 2022-06-14 DIAGNOSIS — H35373 Puckering of macula, bilateral: Secondary | ICD-10-CM | POA: Diagnosis not present

## 2022-06-14 DIAGNOSIS — Z961 Presence of intraocular lens: Secondary | ICD-10-CM | POA: Diagnosis not present

## 2022-06-14 DIAGNOSIS — H26492 Other secondary cataract, left eye: Secondary | ICD-10-CM | POA: Diagnosis not present

## 2022-07-13 DIAGNOSIS — H26492 Other secondary cataract, left eye: Secondary | ICD-10-CM | POA: Diagnosis not present

## 2022-08-03 DIAGNOSIS — S90211A Contusion of right great toe with damage to nail, initial encounter: Secondary | ICD-10-CM | POA: Diagnosis not present

## 2022-09-19 DIAGNOSIS — Z681 Body mass index (BMI) 19 or less, adult: Secondary | ICD-10-CM | POA: Diagnosis not present

## 2022-09-19 DIAGNOSIS — M5431 Sciatica, right side: Secondary | ICD-10-CM | POA: Diagnosis not present

## 2022-10-03 DIAGNOSIS — Z1231 Encounter for screening mammogram for malignant neoplasm of breast: Secondary | ICD-10-CM | POA: Diagnosis not present

## 2022-10-25 DIAGNOSIS — M810 Age-related osteoporosis without current pathological fracture: Secondary | ICD-10-CM | POA: Diagnosis not present

## 2022-11-20 DIAGNOSIS — R079 Chest pain, unspecified: Secondary | ICD-10-CM | POA: Diagnosis not present

## 2022-11-20 DIAGNOSIS — Z0000 Encounter for general adult medical examination without abnormal findings: Secondary | ICD-10-CM | POA: Diagnosis not present

## 2022-11-20 DIAGNOSIS — Z1331 Encounter for screening for depression: Secondary | ICD-10-CM | POA: Diagnosis not present

## 2022-11-20 DIAGNOSIS — I803 Phlebitis and thrombophlebitis of lower extremities, unspecified: Secondary | ICD-10-CM | POA: Diagnosis not present

## 2022-11-20 DIAGNOSIS — M810 Age-related osteoporosis without current pathological fracture: Secondary | ICD-10-CM | POA: Diagnosis not present

## 2022-11-22 DIAGNOSIS — M810 Age-related osteoporosis without current pathological fracture: Secondary | ICD-10-CM | POA: Diagnosis not present

## 2022-11-27 DIAGNOSIS — I2699 Other pulmonary embolism without acute cor pulmonale: Secondary | ICD-10-CM | POA: Diagnosis not present

## 2022-11-27 DIAGNOSIS — M79605 Pain in left leg: Secondary | ICD-10-CM | POA: Diagnosis not present

## 2022-11-27 DIAGNOSIS — R079 Chest pain, unspecified: Secondary | ICD-10-CM | POA: Diagnosis not present

## 2022-11-27 DIAGNOSIS — R0602 Shortness of breath: Secondary | ICD-10-CM | POA: Diagnosis not present

## 2023-01-11 ENCOUNTER — Encounter: Payer: Self-pay | Admitting: Internal Medicine

## 2023-01-23 DIAGNOSIS — Z85828 Personal history of other malignant neoplasm of skin: Secondary | ICD-10-CM | POA: Diagnosis not present

## 2023-01-23 DIAGNOSIS — L821 Other seborrheic keratosis: Secondary | ICD-10-CM | POA: Diagnosis not present

## 2023-01-23 DIAGNOSIS — D225 Melanocytic nevi of trunk: Secondary | ICD-10-CM | POA: Diagnosis not present

## 2023-01-23 DIAGNOSIS — D3612 Benign neoplasm of peripheral nerves and autonomic nervous system, upper limb, including shoulder: Secondary | ICD-10-CM | POA: Diagnosis not present

## 2023-01-23 DIAGNOSIS — D485 Neoplasm of uncertain behavior of skin: Secondary | ICD-10-CM | POA: Diagnosis not present

## 2023-03-06 DIAGNOSIS — Z23 Encounter for immunization: Secondary | ICD-10-CM | POA: Diagnosis not present

## 2023-03-16 DIAGNOSIS — M810 Age-related osteoporosis without current pathological fracture: Secondary | ICD-10-CM | POA: Diagnosis not present

## 2023-03-19 ENCOUNTER — Ambulatory Visit (AMBULATORY_SURGERY_CENTER): Payer: Medicare Other | Admitting: *Deleted

## 2023-03-19 VITALS — Ht 67.5 in | Wt 126.0 lb

## 2023-03-19 DIAGNOSIS — Z860100 Personal history of colon polyps, unspecified: Secondary | ICD-10-CM

## 2023-03-19 DIAGNOSIS — Z1509 Genetic susceptibility to other malignant neoplasm: Secondary | ICD-10-CM

## 2023-03-19 MED ORDER — NA SULFATE-K SULFATE-MG SULF 17.5-3.13-1.6 GM/177ML PO SOLN: 1.0000 | Freq: Once | ORAL | 0 refills | Status: AC

## 2023-03-19 NOTE — Progress Notes (Signed)
Pt's name and DOB verified at the beginning of the pre-visit wit 2 identifiers  Pt denies any difficulty with ambulating,sitting, laying down or rolling side to side  Gave both [facility] main # and MD on call # prior to instructions.   No egg or soy allergy known to patient   No issues known to pt with past sedation with any surgeries or procedures  Pt denies having issues being intubated  Pt has no issues moving head neck or swallowing  No FH of Malignant Hyperthermia  Pt is not on diet pills or shots  Pt is not on home 02   Pt is not on blood thinners   Pt denies issues with constipation   Pt is not on dialysis  Pt denise any abnormal heart rhythms   Pt denies any upcoming cardiac testing  Pt encouraged to use to use Singlecare or Goodrx to reduce cost   Patient's chart reviewed by Cathlyn Parsons CNRA prior to pre-visit and patient appropriate for the [facility].  Pre-visit completed and red dot placed by patient's name on their procedure day (on provider's schedule).  .  Visit by phone  Pt states weight is   Instructed pt why it is important to and  to call if they have any changes in health or new medications. Directed them to the # given and on instructions.     Instructions reviewed with pt and pt states understanding. Instructed to review again prior to procedure. Pt states they will.   Instructions sent by mail with coupon and by my chart

## 2023-03-20 DIAGNOSIS — Z23 Encounter for immunization: Secondary | ICD-10-CM | POA: Diagnosis not present

## 2023-04-10 ENCOUNTER — Encounter: Payer: Self-pay | Admitting: Internal Medicine

## 2023-04-10 ENCOUNTER — Ambulatory Visit (AMBULATORY_SURGERY_CENTER): Payer: Medicare Other | Admitting: Internal Medicine

## 2023-04-10 VITALS — BP 117/56 | HR 65 | Temp 97.4°F | Resp 13 | Ht 67.5 in | Wt 126.0 lb

## 2023-04-10 DIAGNOSIS — D124 Benign neoplasm of descending colon: Secondary | ICD-10-CM | POA: Diagnosis not present

## 2023-04-10 DIAGNOSIS — Z860100 Personal history of colon polyps, unspecified: Secondary | ICD-10-CM

## 2023-04-10 DIAGNOSIS — Z09 Encounter for follow-up examination after completed treatment for conditions other than malignant neoplasm: Secondary | ICD-10-CM

## 2023-04-10 DIAGNOSIS — D123 Benign neoplasm of transverse colon: Secondary | ICD-10-CM | POA: Diagnosis not present

## 2023-04-10 DIAGNOSIS — Z860101 Personal history of adenomatous and serrated colon polyps: Secondary | ICD-10-CM | POA: Diagnosis not present

## 2023-04-10 DIAGNOSIS — Z800 Family history of malignant neoplasm of digestive organs: Secondary | ICD-10-CM

## 2023-04-10 DIAGNOSIS — Z1509 Genetic susceptibility to other malignant neoplasm: Secondary | ICD-10-CM | POA: Diagnosis not present

## 2023-04-10 DIAGNOSIS — K635 Polyp of colon: Secondary | ICD-10-CM | POA: Diagnosis not present

## 2023-04-10 MED ORDER — FAMOTIDINE 40 MG PO TABS: 40.0000 mg | ORAL_TABLET | Freq: Every day | ORAL | 3 refills | Status: DC | PRN

## 2023-04-10 NOTE — Progress Notes (Signed)
GASTROENTEROLOGY PROCEDURE H&P NOTE   Primary Care Physician: Lise Auer, MD    Reason for Procedure:  Colon cancer screening  Plan:    Colonoscopy  Patient is appropriate for endoscopic procedure(s) in the ambulatory ([facility]) setting.  The nature of the procedure, as well as the risks, benefits, and alternatives were carefully and thoroughly reviewed with the patient. Ample time for discussion and questions allowed. The patient understood, was satisfied, and agreed to proceed.     HPI: Angela Juarez is a 77 y.o. female who presents for colonoscopy.  Medical history as below.  Tolerated the prep.  No recent chest pain or shortness of breath.  No abdominal pain today.  Past Medical History:  Diagnosis Date   Arthritis    Cancer (HCC)    squamous cell    Cataract    Osteoporosis    Varicose veins     Past Surgical History:  Procedure Laterality Date   cataract surgery Left 06-04-2014   cataract surgery Right 06-15-2014   COLONOSCOPY  03/25/2004   ENDOVENOUS ABLATION SAPHENOUS VEIN W/ LASER Left 09-24-2014   endovenous laser ablation left greater saphenous vein by Gretta Began MD   ENDOVENOUS ABLATION SAPHENOUS VEIN W/ LASER Right 09-10-2014   endovenous laser ablation right greater saphenous vein by Gretta Began MD   stab phlebectomy Left    >20 incisions left leg by Dr. Tawanna Cooler Early    stab phlebectomy  Right 12-10-2014   >20 incisions (right leg) and sclerotherapy (right leg)    TUBAL LIGATION      Prior to Admission medications   Medication Sig Start Date End Date Taking? Authorizing Provider  aspirin 81 MG tablet Take 81 mg by mouth every other day.   Yes [provider]  Calcium-Magnesium-Vitamin D (CALCIUM 500 PO) Take 1 tablet by mouth daily.   Yes [provider]  cholecalciferol (VITAMIN D) 1000 units tablet Take 1,000 Units by mouth daily.   Yes [provider]  Magnesium 300 MG CAPS Take 1 capsule by mouth daily.   Yes [provider]  Saccharomyces boulardii (PROBIOTIC) 250 MG CAPS Take 1 capsule by mouth daily.   Yes [provider]  denosumab (PROLIA) 60 MG/ML SOLN injection Inject 60 mg into the skin every 6 (six) months. Administer in upper arm, thigh, or abdomen    [provider]  famotidine (PEPCID) 40 MG tablet Take by mouth. 05/09/21   [provider]    Current Outpatient Medications  Medication Sig Dispense Refill   aspirin 81 MG tablet Take 81 mg by mouth every other day.     Calcium-Magnesium-Vitamin D (CALCIUM 500 PO) Take 1 tablet by mouth daily.     cholecalciferol (VITAMIN D) 1000 units tablet Take 1,000 Units by mouth daily.     Magnesium 300 MG CAPS Take 1 capsule by mouth daily.     Saccharomyces boulardii (PROBIOTIC) 250 MG CAPS Take 1 capsule by mouth daily.     denosumab (PROLIA) 60 MG/ML SOLN injection Inject 60 mg into the skin every 6 (six) months. Administer in upper arm, thigh, or abdomen     famotidine (PEPCID) 40 MG tablet Take by mouth.     No current facility-administered medications for this visit.    Allergies as of 04/10/2023 - Review Complete 04/10/2023  Allergen Reaction Noted   Thimerosal (thiomersal) Itching and Swelling 08/03/2022    Family History  Problem Relation Age of Onset   Stroke Father  Cancer Father        cancer   Varicose Veins Father    Bleeding Disorder Father    Colon cancer Brother    Cancer Brother    Colon polyps Neg Hx    Esophageal cancer Neg Hx    Stomach cancer Neg Hx    Rectal cancer Neg Hx     Social History   Socioeconomic History   Marital status: Married    Spouse name: Not on file   Number of children: Not on file   Years of education: Not on file   Highest education level: Not on file  Occupational History   Not on file  Tobacco Use   Smoking status: Never   Smokeless tobacco: Never  Vaping Use   Vaping status: Never Used  Substance and Sexual Activity   Alcohol use: Yes     Alcohol/week: 0.0 standard drinks of alcohol    Comment: occ   Drug use: No   Sexual activity: Not on file  Other Topics Concern   Not on file  Social History Narrative   Not on file   Social Determinants of Health   Financial Resource Strain: Not on file  Food Insecurity: Not on file  Transportation Needs: Not on file  Physical Activity: Not on file  Stress: Not on file  Social Connections: Not on file  Intimate Partner Violence: Not on file    Physical Exam: Vital signs in last 24 hours: @BP  (!) 117/55   Pulse (!) 55   Temp (!) 97.4 F (36.3 C)   Resp 14   Ht 5' 7.5" (1.715 m)   Wt 126 lb (57.2 kg)   SpO2 100%   BMI 19.44 kg/m  GEN: NAD EYE: Sclerae anicteric ENT: MMM CV: Non-tachycardic Pulm: CTA b/l GI: Soft, NT/ND NEURO:  Alert & Oriented x 3   Erick Blinks, MD [facility]  04/10/2023 11:32 AM

## 2023-04-10 NOTE — Progress Notes (Signed)
Pt's states no medical or surgical changes since previsit or office visit. 

## 2023-04-10 NOTE — Patient Instructions (Addendum)
Educational handout provided to patient related to Polyps and Diverticulosis  Resume previous diet  Continue present medications  Awaiting pathology results  YOU HAD AN ENDOSCOPIC PROCEDURE [facility] THE [facility]:   Refer to the procedure report that was given to you for any specific questions about what was found during the examination.  If the procedure report does not answer your questions, please call your gastroenterologist to clarify.  If you requested that your care partner not be given the details of your procedure findings, then the procedure report has been included in a sealed envelope for you to review at your convenience later.  YOU SHOULD EXPECT: Some feelings of bloating in the abdomen. Passage of more gas than usual.  Walking can help get rid of the air that was put into your GI tract during the procedure and reduce the bloating. If you had a lower endoscopy (such as a colonoscopy or flexible sigmoidoscopy) you may notice spotting of blood in your stool or on the toilet paper. If you underwent a bowel prep for your procedure, you may not have a normal bowel movement for a few days.  Please Note:  You might notice some irritation and congestion in your nose or some drainage.  This is from the oxygen used during your procedure.  There is no need for concern and it should clear up in a day or so.  SYMPTOMS TO REPORT IMMEDIATELY:  Following lower endoscopy (colonoscopy or flexible sigmoidoscopy):  Excessive amounts of blood in the stool  Significant tenderness or worsening of abdominal pains  Swelling of the abdomen that is new, acute  Fever of 100F or higher  For urgent or emergent issues, a gastroenterologist can be reached at any hour by calling [phone]. Do not use MyChart messaging for urgent concerns.    DIET:  We do recommend a small meal at first, but then you may proceed to your regular diet.  Drink plenty of fluids but you should avoid alcoholic  beverages for 24 hours.  ACTIVITY:  You should plan to take it easy for the rest of today and you should NOT DRIVE or use heavy machinery until tomorrow (because of the sedation medicines used during the test).    FOLLOW UP: Our staff will call the number listed on your records the next business day following your procedure.  We will call around 7:15- 8:00 am to check on you and address any questions or concerns that you may have regarding the information given to you following your procedure. If we do not reach you, we will leave a message.     If any biopsies were taken you will be contacted by phone or by letter within the next 1-3 weeks.  Please call us at [phone] if you have not heard about the biopsies in 3 weeks.    SIGNATURES/CONFIDENTIALITY: You and/or your care partner have signed paperwork which will be entered into your electronic medical record.  These signatures attest to the fact that that the information above on your After Visit Summary has been reviewed and is understood.  Full responsibility of the confidentiality of this discharge information lies with you and/or your care-partner.

## 2023-04-10 NOTE — Op Note (Addendum)
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                            Kinnie Scales (adenoma x 1 removed) Medicines:                Monitored Anesthesia Care Procedure:                Pre-Anesthesia Assessment:                           - Prior to the procedure, a History and Physical                            was performed, and patient medications and                            allergies were reviewed. The patient's tolerance of                            previous anesthesia was also reviewed. The risks                            and benefits of the procedure and the sedation                            options and risks were discussed with the patient.                            All questions were answered, and informed consent                            was obtained. Prior Anticoagulants: The patient has                            taken no anticoagulant or antiplatelet agents. ASA                            Grade Assessment: II - A patient with mild systemic                            disease. After reviewing the risks and benefits,  the patient was deemed in satisfactory condition to                            undergo the procedure.                           After obtaining informed consent, the colonoscope                            was passed under direct vision. Throughout the                            procedure, the patient's blood pressure, pulse, and                            oxygen saturations were monitored continuously. The                             PCF-H190TL Slim SN [id number] was introduced through                            the anus and advanced to the cecum, identified by                            appendiceal orifice and ileocecal valve. The                            colonoscopy was performed without difficulty. The                            patient tolerated the procedure well. The quality                            of the bowel preparation was good. The ileocecal                            valve, appendiceal orifice, and rectum were                            photographed. Scope In: 11:42:40 AM Scope Out: 12:00:33 PM Scope Withdrawal Time: 0 hours 12 minutes 34 seconds  Total Procedure Duration: 0 hours 17 minutes 53 seconds  Findings:                 The digital rectal exam was normal.                           A 2 mm polyp was found in the transverse colon. The                            polyp was sessile. The polyp was removed with a                            cold snare. Resection and retrieval were complete.  Two sessile polyps were found in the descending                            colon. The polyps were 4 to 6 mm in size. These                            polyps were removed with a cold snare. Resection                            and retrieval were complete.                           Multiple medium-mouthed and small-mouthed                            diverticula were found in the sigmoid colon.                           The retroflexed view of the distal rectum and anal                            verge was normal and showed no anal or rectal                            abnormalities. Complications:            No immediate complications. Estimated Blood Loss:     Estimated blood loss was minimal. Impression:               - One 2 mm polyp in the transverse colon, removed                            with a cold snare. Resected and retrieved.                           -  Two 4 to 6 mm polyps in the descending colon,                            removed with a cold snare. Resected and retrieved.                           - Mild diverticulosis in the sigmoid colon.                           - The distal rectum and anal verge are normal on                            retroflexion view. Recommendation:           - Patient has a contact number available for                            emergencies. The signs and symptoms of potential  delayed complications were discussed with the                            patient. Return to normal activities tomorrow.                            Written discharge instructions were provided to the                            patient.                           - Resume previous diet.                           - Continue present medications.                           - Await pathology results.                           - Repeat colonoscopy in 2 years for surveillance in                            setting of Lynch syndrome. EGD recommended on same                            day. Beverley Fiedler, MD 04/10/2023 12:11:57 PM This report has been signed electronically.

## 2023-04-10 NOTE — Progress Notes (Signed)
Called to room to assist during endoscopic procedure.  Patient ID and intended procedure confirmed with present staff. Received instructions for my participation in the procedure from the performing physician.  

## 2023-04-10 NOTE — Progress Notes (Signed)
Report to PACU, RN, vss, BBS= Clear.  

## 2023-04-11 ENCOUNTER — Telehealth: Payer: Self-pay

## 2023-04-11 NOTE — Telephone Encounter (Signed)
  Follow up Call-     04/10/2023   10:52 AM  Call back number  Post procedure Call Back phone  # [phone]  Permission to leave phone message Yes     Patient questions:  Do you have a fever, pain , or abdominal swelling? No. Pain Score  0 *  Have you tolerated food without any problems? Yes.    Have you been able to return to your normal activities? Yes.    Do you have any questions about your discharge instructions: Diet   No. Medications  No. Follow up visit  No.  Do you have questions or concerns about your Care? Patient was wondering when she would need to come back for her next colonoscopy. 2 yrs vs 1 yr, I explained that when the pathology results come back we would let her know. SHe sgreed and had no further questions.   Actions: * If pain score is 4 or above: No action needed, pain <4.

## 2023-04-12 ENCOUNTER — Telehealth: Payer: Self-pay | Admitting: Internal Medicine

## 2023-04-12 LAB — SURGICAL PATHOLOGY

## 2023-04-12 NOTE — Telephone Encounter (Signed)
Ok, will be here to receive records.

## 2023-04-12 NOTE — Telephone Encounter (Signed)
Inbound call from patient stating she will be able to bring in previous colonoscopy records in tomorrow morning. Please advise, thank you.

## 2023-04-17 ENCOUNTER — Encounter: Payer: Self-pay | Admitting: Internal Medicine

## 2023-04-30 DIAGNOSIS — M810 Age-related osteoporosis without current pathological fracture: Secondary | ICD-10-CM | POA: Diagnosis not present

## 2023-05-07 ENCOUNTER — Other Ambulatory Visit: Payer: Self-pay | Admitting: *Deleted

## 2023-05-07 ENCOUNTER — Ambulatory Visit (HOSPITAL_COMMUNITY)
Admission: RE | Admit: 2023-05-07 | Discharge: 2023-05-07 | Disposition: A | Discharge status: Home / Self Care | Payer: Medicare Other | Source: Ambulatory Visit | Attending: Vascular Surgery | Admitting: Vascular Surgery

## 2023-05-07 DIAGNOSIS — I83892 Varicose veins of left lower extremities with other complications: Secondary | ICD-10-CM

## 2023-05-17 ENCOUNTER — Encounter (HOSPITAL_COMMUNITY): Payer: Medicare Other

## 2023-06-12 NOTE — Progress Notes (Signed)
Office Note     CC: Painful varicosities Requesting Provider:  Lise Auer, MD  HPI: Angela Juarez is a 78 y.o. (22-Feb-1946) female presenting at Juarez request of .Lise Auer, MD for evaluation of painful varicosities.  Angela Juarez is well-known to our practice having previously undergone bilateral greater saphenous vein ablation, stab phlebectomy for symptomatic varicosities.  On exam, Angela Juarez was doing well, accompanied by her husband.  Now retired, they continue to live an active lifestyle, walking 3 to 4 miles a day.  Over Juarez last few years, she has appreciated pain that has progressed involving Juarez left knee.  She describes pain on Juarez lateral aspect as well as in Juarez popliteal fossa, and sometimes involving Juarez medial aspect of Juarez knee.  She appreciates Juarez pain when standing, and notes improvement when sitting down with her feet up in a recliner.  She also appreciates pain at night in Juarez knee.  There is associated weakness, which is limiting her ability to walk further, as well as move from Juarez sitting to standing position efficiently.  Angela Juarez medial aspect of Juarez distal thigh.  She denies asymmetric swelling.  She wears compression stockings daily, but has not bought a new pair in quite some time.  She is hoping to have Juarez knee pain resolved prior to her trip to [place] this summer.   Past Medical History:  Diagnosis Date   Arthritis    Cancer (HCC)    squamous cell    Cataract    Osteoporosis    Varicose veins     Past Surgical History:  Procedure Laterality Date   cataract surgery Left 06-04-2014   cataract surgery Right 06-15-2014   COLONOSCOPY  03/25/2004   ENDOVENOUS ABLATION SAPHENOUS VEIN W/ LASER Left 09-24-2014   endovenous laser ablation left greater saphenous vein by Gretta Began MD   ENDOVENOUS ABLATION SAPHENOUS VEIN W/ LASER Right 09-10-2014   endovenous laser ablation right greater saphenous vein by Gretta Began MD   stab  phlebectomy Left    >20 incisions left leg by Dr. Tawanna Cooler Early    stab phlebectomy  Right 12-10-2014   >20 incisions (right leg) and sclerotherapy (right leg)    TUBAL LIGATION      Social History   Socioeconomic History   Marital status: Married    Spouse name: Not on file   Number of children: Not on file   Years of education: Not on file   Highest education level: Not on file  Occupational History   Not on file  Tobacco Use   Smoking status: Never   Smokeless tobacco: Never  Vaping Use   Vaping status: Never Used  Substance and Sexual Activity   Alcohol use: Yes    Alcohol/week: 0.0 standard drinks of alcohol    Comment: occ   Drug use: No   Sexual activity: Not on file  Other Topics Concern   Not on file  Social History Narrative   Not on file   Social Drivers of Health   Financial Resource Strain: Not on file  Food Insecurity: Not on file  Transportation Needs: Not on file  Physical Activity: Not on file  Stress: Not on file  Social Connections: Not on file  Intimate Partner Violence: Not on file   Family History  Problem Relation Age of Onset   Stroke Father    Cancer Father        cancer   Varicose  Veins Father    Bleeding Disorder Father    Colon cancer Brother    Cancer Brother    Colon polyps Neg Hx    Esophageal cancer Neg Hx    Stomach cancer Neg Hx    Rectal cancer Neg Hx     Current Outpatient Medications  Medication Sig Dispense Refill   aspirin 81 MG tablet Take 81 mg by mouth every other day.     Calcium-Magnesium-Vitamin D (CALCIUM 500 PO) Take 1 tablet by mouth daily.     cholecalciferol (VITAMIN D) 1000 units tablet Take 1,000 Units by mouth daily.     denosumab (PROLIA) 60 MG/ML SOLN injection Inject 60 mg into Juarez skin every 6 (six) months. Administer in upper arm, thigh, or abdomen     famotidine (PEPCID) 40 MG tablet Take 1 tablet (40 mg total) by mouth daily as needed for heartburn or indigestion. 30 tablet 3   Magnesium 300 MG  CAPS Take 1 capsule by mouth daily.     Saccharomyces boulardii (PROBIOTIC) 250 MG CAPS Take 1 capsule by mouth daily.     No current facility-administered medications for this visit.    Allergies  Allergen Reactions   Thimerosal (Thiomersal) Itching and Swelling     REVIEW OF SYSTEMS:  [X]  denotes positive finding, [ ]  denotes negative finding Cardiac  Comments:  Chest pain or chest pressure:    Shortness of breath upon exertion:    Short of breath when lying flat:    Irregular heart rhythm:        Vascular    Pain in calf, thigh, or hip brought on by ambulation:    Pain in feet at night that wakes you up from your sleep:     Blood clot in your veins:    Leg swelling:         Pulmonary    Oxygen at home:    Productive cough:     Wheezing:         Neurologic    Sudden weakness in arms or legs:     Sudden numbness in arms or legs:     Sudden onset of difficulty speaking or slurred speech:    Temporary loss of vision in one eye:     Problems with dizziness:         Gastrointestinal    Blood in stool:     Vomited blood:         Genitourinary    Burning when urinating:     Blood in urine:        Psychiatric    Major depression:         Hematologic    Bleeding problems:    Problems with blood clotting too easily:        Skin    Rashes or ulcers:        Constitutional    Fever or chills:      PHYSICAL EXAMINATION:  There were no vitals filed for this visit.  General:  WDWN in NAD; vital signs documented above Gait: Not observed HENT: WNL, normocephalic Pulmonary: normal non-labored breathing , without wheezing Cardiac: regular HR Abdomen: soft, NT, no masses Skin: without rashes Vascular Exam/Pulses:  Right Left  Radial 2+ (normal) 2+ (normal)  Ulnar    Femoral    Popliteal    DP 2+ (normal) 2+ (normal)  PT     Extremities: without ischemic changes, without Gangrene , without cellulitis; without open wounds;  Musculoskeletal: no  muscle  wasting or atrophy  Neurologic: A&O X 3;  No focal weakness or paresthesias are detected Psychiatric:  Juarez pt has Normal affect.   Non-Invasive Vascular Imaging:    Summary:  Left:  - No evidence of deep vein thrombosis seen in Juarez left lower extremity,  from Juarez common femoral through Juarez popliteal veins.  - No evidence of superficial venous thrombosis in Juarez left lower  extremity.    - Venous reflux is noted in Juarez left sapheno-femoral junction.  - Venous reflux is noted in Juarez left greater saphenous vein in Juarez calf.  - Venous reflux is noted in Juarez left varicosities of Juarez medial and  lateral thigh.     ASSESSMENT/PLAN: AAIRA BODILY is a 78 y.o. female presenting with history of venous ablation, stab phlebectomy with knee pain that has progressed over Juarez last 2 years.  Juarez knee pain she describes is present when standing, as well as when lying down and there is associated weakness which limits her ability to move from sitting to standing. Juarez pain also limits Juarez distance which she can ambulate.  I had a long discussion with Arabelle regarding Juarez above.  Symptomatic varicose veins, although they can be painful, should not have any associated weakness, nor should Juarez pain involve Juarez entirety of Juarez knee.  Juarez pain should be focal.  I think Juarez knee pain she is describing has a musculoskeletal etiology.  We discussed sending a referral to [facility], where her husband is currently a patient.  I have referred her to Dr. Ollen Gross.  Should pain in Juarez medial varicosity worsen, I am happy to see her back to discuss stat phlebectomy.  At this point, Michaya can follow-up with me as needed.    Victorino Sparrow, MD [facility] [phone]

## 2023-06-14 ENCOUNTER — Encounter: Payer: Self-pay | Admitting: Vascular Surgery

## 2023-06-14 ENCOUNTER — Ambulatory Visit: Payer: Medicare Other | Admitting: Vascular Surgery

## 2023-06-14 VITALS — BP 119/73 | HR 64 | Temp 98.3°F | Resp 20 | Ht 67.5 in | Wt 126.8 lb

## 2023-06-14 DIAGNOSIS — I83892 Varicose veins of left lower extremities with other complications: Secondary | ICD-10-CM | POA: Diagnosis not present

## 2023-07-06 ENCOUNTER — Other Ambulatory Visit: Payer: Self-pay | Admitting: Internal Medicine

## 2023-10-10 ENCOUNTER — Other Ambulatory Visit: Payer: Self-pay | Admitting: Obstetrics and Gynecology

## 2023-10-10 DIAGNOSIS — R928 Other abnormal and inconclusive findings on diagnostic imaging of breast: Secondary | ICD-10-CM

## 2023-10-24 ENCOUNTER — Ambulatory Visit
Admission: RE | Admit: 2023-10-24 | Discharge: 2023-10-24 | Disposition: A | Discharge status: Home / Self Care | Source: Ambulatory Visit | Attending: Obstetrics and Gynecology | Admitting: Obstetrics and Gynecology

## 2023-10-24 DIAGNOSIS — R928 Other abnormal and inconclusive findings on diagnostic imaging of breast: Secondary | ICD-10-CM

## 2023-10-26 ENCOUNTER — Other Ambulatory Visit

## 2023-10-26 ENCOUNTER — Encounter
